# Patient Record
Sex: Male | Born: 1955 | Race: White | Hispanic: No | Marital: Married
Health system: Southern US, Community
[De-identification: ages and names within clinical notes are randomized; demographics above are authoritative.]

## PROBLEM LIST (undated history)

## (undated) DIAGNOSIS — J398 Other specified diseases of upper respiratory tract: Secondary | ICD-10-CM

## (undated) DIAGNOSIS — I6329 Cerebral infarction due to unspecified occlusion or stenosis of other precerebral arteries: Secondary | ICD-10-CM

## (undated) DIAGNOSIS — Z930 Tracheostomy status: Secondary | ICD-10-CM

## (undated) DIAGNOSIS — J9621 Acute and chronic respiratory failure with hypoxia: Secondary | ICD-10-CM

---

## 2018-09-16 ENCOUNTER — Inpatient Hospital Stay
Admission: RE | Admit: 2018-09-16 | Discharge: 2018-10-15 | Disposition: A | Discharge status: Skilled Nursing Facility | Payer: Medicare Other | Source: Other Acute Inpatient Hospital | Attending: Internal Medicine | Admitting: Internal Medicine

## 2018-09-16 ENCOUNTER — Other Ambulatory Visit (HOSPITAL_COMMUNITY): Payer: Self-pay

## 2018-09-16 DIAGNOSIS — I6329 Cerebral infarction due to unspecified occlusion or stenosis of other precerebral arteries: Secondary | ICD-10-CM | POA: Diagnosis present

## 2018-09-16 DIAGNOSIS — J398 Other specified diseases of upper respiratory tract: Secondary | ICD-10-CM | POA: Diagnosis present

## 2018-09-16 DIAGNOSIS — Z931 Gastrostomy status: Secondary | ICD-10-CM

## 2018-09-16 DIAGNOSIS — Z930 Tracheostomy status: Secondary | ICD-10-CM

## 2018-09-16 DIAGNOSIS — J9621 Acute and chronic respiratory failure with hypoxia: Secondary | ICD-10-CM | POA: Diagnosis present

## 2018-09-16 DIAGNOSIS — Z431 Encounter for attention to gastrostomy: Secondary | ICD-10-CM

## 2018-09-16 HISTORY — DX: Tracheostomy status: Z93.0

## 2018-09-16 HISTORY — DX: Acute and chronic respiratory failure with hypoxia: J96.21

## 2018-09-16 HISTORY — DX: Other specified diseases of upper respiratory tract: J39.8

## 2018-09-16 HISTORY — DX: Cerebral infarction due to unspecified occlusion or stenosis of other precerebral arteries: I63.29

## 2018-09-16 MED ORDER — IOHEXOL 300 MG/ML  SOLN
50.0000 mL | Freq: Once | INTRAMUSCULAR | Status: AC | PRN
  Administered 2018-09-16: 50 mL via ORAL

## 2018-09-17 ENCOUNTER — Other Ambulatory Visit (HOSPITAL_COMMUNITY): Payer: Self-pay

## 2018-09-17 ENCOUNTER — Encounter: Payer: Self-pay | Admitting: Internal Medicine

## 2018-09-17 DIAGNOSIS — J9621 Acute and chronic respiratory failure with hypoxia: Secondary | ICD-10-CM

## 2018-09-17 DIAGNOSIS — J398 Other specified diseases of upper respiratory tract: Secondary | ICD-10-CM

## 2018-09-17 DIAGNOSIS — I6329 Cerebral infarction due to unspecified occlusion or stenosis of other precerebral arteries: Secondary | ICD-10-CM

## 2018-09-17 DIAGNOSIS — Z930 Tracheostomy status: Secondary | ICD-10-CM | POA: Diagnosis not present

## 2018-09-17 LAB — COMPREHENSIVE METABOLIC PANEL
ALT: 92 U/L — ABNORMAL HIGH (ref 0–44)
AST: 39 U/L (ref 15–41)
Albumin: 3.4 g/dL — ABNORMAL LOW (ref 3.5–5.0)
Alkaline Phosphatase: 93 U/L (ref 38–126)
Anion gap: 11 (ref 5–15)
BUN: 40 mg/dL — ABNORMAL HIGH (ref 8–23)
CO2: 23 mmol/L (ref 22–32)
Calcium: 9.7 mg/dL (ref 8.9–10.3)
Chloride: 112 mmol/L — ABNORMAL HIGH (ref 98–111)
Creatinine, Ser: 1.2 mg/dL (ref 0.61–1.24)
GFR calc Af Amer: >60 mL/min (ref >60)
GFR calc non Af Amer: >60 mL/min (ref >60)
Glucose, Bld: 155 mg/dL — ABNORMAL HIGH (ref 70–99)
Potassium: 3.9 mmol/L (ref 3.5–5.1)
Sodium: 146 mmol/L — ABNORMAL HIGH (ref 135–145)
Total Bilirubin: 1.5 mg/dL — ABNORMAL HIGH (ref 0.3–1.2)
Total Protein: 7.9 g/dL (ref 6.5–8.1)

## 2018-09-17 LAB — CBC WITH DIFFERENTIAL/PLATELET
Abs Immature Granulocytes: 0.22 10*3/uL — ABNORMAL HIGH (ref 0.00–0.07)
Basophils Absolute: 0.1 10*3/uL (ref 0.0–0.1)
Basophils Relative: 0 %
Eosinophils Absolute: 0 10*3/uL (ref 0.0–0.5)
Eosinophils Relative: 0 %
HCT: 45.6 % (ref 39.0–52.0)
Hemoglobin: 14.8 g/dL (ref 13.0–17.0)
Immature Granulocytes: 1 %
Lymphocytes Relative: 3 %
Lymphs Abs: 0.7 10*3/uL (ref 0.7–4.0)
MCH: 28.7 pg (ref 26.0–34.0)
MCHC: 32.5 g/dL (ref 30.0–36.0)
MCV: 88.5 fL (ref 80.0–100.0)
Monocytes Absolute: 1.7 10*3/uL — ABNORMAL HIGH (ref 0.1–1.0)
Monocytes Relative: 7 %
Neutro Abs: 22.1 10*3/uL — ABNORMAL HIGH (ref 1.7–7.7)
Neutrophils Relative %: 89 %
Platelets: 669 10*3/uL — ABNORMAL HIGH (ref 150–400)
RBC: 5.15 MIL/uL (ref 4.22–5.81)
RDW: 12.3 % (ref 11.5–15.5)
WBC: 24.8 10*3/uL — ABNORMAL HIGH (ref 4.0–10.5)
nRBC: 0 % (ref 0.0–0.2)

## 2018-09-17 LAB — C DIFFICILE QUICK SCREEN W PCR REFLEX
C Diff antigen: NEGATIVE
C Diff interpretation: NOT DETECTED
C Diff toxin: NEGATIVE

## 2018-09-17 LAB — PROTIME-INR
INR: 1.2 (ref 0.8–1.2)
Prothrombin Time: 15.2 seconds (ref 11.4–15.2)

## 2018-09-17 LAB — PHOSPHORUS: Phosphorus: 4 mg/dL (ref 2.5–4.6)

## 2018-09-17 LAB — MAGNESIUM: Magnesium: 2.4 mg/dL (ref 1.7–2.4)

## 2018-09-17 MED ORDER — NEXTERONE IV
81.00 | INTRAVENOUS | Status: DC
Start: 2018-09-17 — End: 2018-09-17

## 2018-09-17 MED ORDER — SENNOSIDES-DOCUSATE SODIUM 8.6-50 MG PO TABS
1.00 | ORAL_TABLET | ORAL | Status: DC
Start: ? — End: 2018-09-17

## 2018-09-17 MED ORDER — ERYTHROMYCIN 5 MG/GM OP OINT
TOPICAL_OINTMENT | OPHTHALMIC | Status: DC
Start: 2018-09-16 — End: 2018-09-17

## 2018-09-17 MED ORDER — VICON FORTE PO CAPS
17.00 | ORAL_CAPSULE | ORAL | Status: DC
Start: 2018-09-16 — End: 2018-09-17

## 2018-09-17 MED ORDER — GLUCAGON HCL RDNA (DIAGNOSTIC) 1 MG IJ SOLR
1.00 | INTRAMUSCULAR | Status: DC
Start: ? — End: 2018-09-17

## 2018-09-17 MED ORDER — RA COLD/COUGH DM 15-1-5 MG/5ML PO ELIX
80.00 | ORAL_SOLUTION | ORAL | Status: DC
Start: 2018-09-17 — End: 2018-09-17

## 2018-09-17 MED ORDER — Medication
5.00 | Status: DC
Start: ? — End: 2018-09-17

## 2018-09-17 MED ORDER — BENICAR 20 MG PO TABS
12.50 | ORAL_TABLET | ORAL | Status: DC
Start: ? — End: 2018-09-17

## 2018-09-17 MED ORDER — DURASORB UNDERPAD MISC
2.00 | Status: DC
Start: ? — End: 2018-09-17

## 2018-09-17 MED ORDER — PREDNISOLONE ACETATE 1 % OP SUSP
1.00 | OPHTHALMIC | Status: DC
Start: 2018-09-16 — End: 2018-09-17

## 2018-09-17 MED ORDER — Medication
Status: DC
Start: ? — End: 2018-09-17

## 2018-09-17 MED ORDER — ENOXAPARIN SODIUM 40 MG/0.4ML ~~LOC~~ SOLN
40.00 | SUBCUTANEOUS | Status: DC
Start: 2018-09-17 — End: 2018-09-17

## 2018-09-17 MED ORDER — GENERIC EXTERNAL MEDICATION
10.00 | Status: DC
Start: 2018-09-16 — End: 2018-09-17

## 2018-09-17 MED ORDER — VICON FORTE PO CAPS
17.00 | ORAL_CAPSULE | ORAL | Status: DC
Start: ? — End: 2018-09-17

## 2018-09-17 MED ORDER — GENERIC EXTERNAL MEDICATION
16.00 | Status: DC
Start: ? — End: 2018-09-17

## 2018-09-17 MED ORDER — ASPIRIN 300 MG RE SUPP
300.00 | RECTAL | Status: DC
Start: ? — End: 2018-09-17

## 2018-09-17 MED ORDER — DICLOXACILLIN SODIUM 62.5 MG/5ML PO SUSR
10.00 | ORAL | Status: DC
Start: ? — End: 2018-09-17

## 2018-09-17 MED ORDER — MULTIVITAMIN WOMEN PO
1.00 | ORAL | Status: DC
Start: ? — End: 2018-09-17

## 2018-09-17 MED ORDER — NITROGLYCERIN 0.4 MG SL SUBL
0.40 | SUBLINGUAL_TABLET | SUBLINGUAL | Status: DC
Start: ? — End: 2018-09-17

## 2018-09-17 MED ORDER — LACTULOSE 10 GM/15ML PO SOLN
20.00 | ORAL | Status: DC
Start: ? — End: 2018-09-17

## 2018-09-17 MED ORDER — CLOPIDOGREL BISULFATE 75 MG PO TABS
75.00 | ORAL_TABLET | ORAL | Status: DC
Start: 2018-09-17 — End: 2018-09-17

## 2018-09-17 MED ORDER — PETROLATUM WHITE EX
10.00 | CUTANEOUS | Status: DC
Start: ? — End: 2018-09-17

## 2018-09-17 NOTE — Consult Note (Signed)
[facility] [place]  Date of Service: 09/17/2018  PULMONARY CRITICAL CARE CONSULT   Jaicob Jimenez  SWF:[id number]  DOB: Jun 11, 1955   DOA: 09/16/2018  Referring Physician: Merton Border, MD  HPI: Adam Jimenez is a 63 y.o. male seen for follow up of Acute on Chronic Respiratory Failure.  Patient has multiple medical problems including hypertension hyperlipidemia transient ischemic attacks presented to the hospital with facial droop.  Patient apparently was unable to get up out of bed because of dizziness.  Patient ended up in the emergency department CT at that time was not done because the scanner was down.  It was also felt that the patient was outside thrombolytics window.  Eventually patient ended up with an MRI of the brain showed an acute infarction of the left cerebellum right pons and middle cerebellar peduncle along with left thalamus.  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  Hospital course was complicated by not being able to come off the ventilator and ended up with prolonged mechanical ventilation.  Patient eventually ended up having to have a tracheostomy.  Now patient is off the ventilator on T collar.  Is been somewhat confused.  Review of Systems:  ROS performed and is unremarkable other than noted above.  Past Medical History: Recent TIA, HTN just started on norvasc, dyslipidemia  Past Surgical History: Fracture repair LLE  Allergies: Allergies not on file  Medications: Medications Prior to Admission  Medication Sig Dispense Refill Last Dose  . amLODIPine (NORVASC) 5 MG tablet Take 5 mg by mouth daily.  Marland Kitchen aspirin 325 MG tablet Take 325 mg by mouth daily.  Marland Kitchen atorvastatin (LIPITOR) 80 MG tablet Take 80 mg by mouth daily.    Family History: Arthritis   Social History: Never smoked, no daily etoh, denies drug use    Medications: Reviewed on Rounds  Physical Exam:  Vitals: Temperature 98.3 pulse 79 respiratory rate 20 blood pressure 147/84 saturations 97%  Ventilator Settings off the ventilator on T collar 28% FiO2  . General: Comfortable at this time . Eyes: Grossly normal lids, irises & conjunctiva . ENT: grossly tongue is normal . Neck: no obvious mass . Cardiovascular: S1-S2 normal no gallop or rub . Respiratory: No rhonchi no rales are noted at this time . Abdomen: Soft and nontender . Skin: no rash seen on limited exam . Musculoskeletal: not rigid . Psychiatric:unable to assess . Neurologic: no seizure no involuntary movements         Labs on Admission:  Basic Metabolic Panel: Recent Labs  Lab 09/17/18 0857  NA 146*  K 3.9  CL 112*  CO2 23  GLUCOSE 155*  BUN 40*  CREATININE 1.20  CALCIUM 9.7  MG 2.4  PHOS 4.0    No results for input(s): PHART, PCO2ART, PO2ART, HCO3, O2SAT in the last 168 hours.  Liver Function Tests: Recent Labs  Lab 09/17/18 0857  AST 39  ALT 92*  ALKPHOS 93  BILITOT 1.5*  PROT 7.9  ALBUMIN 3.4*   No results for input(s): LIPASE, AMYLASE in the last 168 hours. No results for input(s): AMMONIA in the last 168 hours.  CBC: Recent Labs  Lab 09/17/18 0857  WBC 24.8*  NEUTROABS 22.1*  HGB 14.8  HCT 45.6  MCV 88.5  PLT 669*    Cardiac Enzymes: No results for input(s): CKTOTAL, CKMB, CKMBINDEX, TROPONINI in the last 168 hours.  BNP (last 3  results) No results for input(s): BNP in the last 8760 hours.  ProBNP (last 3 results) No results for input(s): PROBNP in the last 8760 hours.   Radiological Exams on Admission: Dg Abdomen Peg Tube Location  Result Date: 09/16/2018 CLINICAL DATA:  Peg tube placement EXAM: ABDOMEN - 1 VIEW COMPARISON:  None. FINDINGS: 50 mL Omnipaque 300 administered through patient's PEG tube. Single overhead radiograph obtained. Gastrostomy balloon visible within the body of the stomach. Contrast opacifies the  stomach and proximal duodenum. No obvious extravasation. IMPRESSION: Peg tube appears within the proximal to mid gastric body. No extraluminal extravasation Electronically Signed   By: Adam Jimenez  Adam JimenezD.   On: 09/16/2018 21:52   Dg Chest Port 1 View  Result Date: 09/17/2018 CLINICAL DATA:  Tracheostomy. EXAM: PORTABLE CHEST 1 VIEW COMPARISON:  None. FINDINGS: Tracheostomy tube is in place. The heart is enlarged. Lung volumes are low. Mild pulmonary vascular congestion is present without frank edema. There is no significant consolidation. Visualized soft tissues and bony thorax are unremarkable. IMPRESSION: 1. Cardiomegaly and mild pulmonary vascular congestion without frank edema. 2. Low lung volumes. 3. Tracheostomy tube in situ. Electronically Signed   By: Adam Jimenez  Adam Jimenez JimenezD.   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 Acute on chronic respiratory failure with hypoxia at this time patient is on T collar weaning.  Secretions are still significant and copious.  Will continue with aggressive pulmonary toilet supportive care hopefully will be able to begin capping if secretions do improve 2. Cerebellar stroke patient had basilar artery occlusion multiple involvement.  Continue with supportive care patient is status post thrombectomy. 3. Tracheostomy we are going to work towards weaning and eventual decannulation. 4. Increased tracheal secretions.  The patient has been having issues with the secretions even at the other facility and pulmonary recommendations over they are aware not to downsize the trach because of the excessive secretions.  We will continue with aggressive pulmonary toilet consider anticholinergics if not improved we will continue to assess daily  I have personally seen and  evaluated the patient, evaluated laboratory and imaging results, formulated the assessment and plan and placed orders. The Patient requires high complexity decision making for assessment and support.  Case was discussed on Rounds with the Respiratory Therapy Staff Time Spent 70minutes  Yevonne PaxSaadat A , MD [facility] Pulmonary Critical Care Medicine Sleep Medicine

## 2018-09-18 DIAGNOSIS — J9621 Acute and chronic respiratory failure with hypoxia: Secondary | ICD-10-CM | POA: Diagnosis not present

## 2018-09-18 DIAGNOSIS — I6329 Cerebral infarction due to unspecified occlusion or stenosis of other precerebral arteries: Secondary | ICD-10-CM | POA: Diagnosis not present

## 2018-09-18 DIAGNOSIS — Z930 Tracheostomy status: Secondary | ICD-10-CM | POA: Diagnosis not present

## 2018-09-18 DIAGNOSIS — J398 Other specified diseases of upper respiratory tract: Secondary | ICD-10-CM | POA: Diagnosis not present

## 2018-09-18 LAB — URINALYSIS, ROUTINE W REFLEX MICROSCOPIC
Bilirubin Urine: NEGATIVE
Glucose, UA: NEGATIVE mg/dL
Ketones, ur: NEGATIVE mg/dL
Nitrite: NEGATIVE
Protein, ur: 100 mg/dL — AB
RBC / HPF: >50 RBC/hpf — ABNORMAL HIGH (ref 0–5)
Specific Gravity, Urine: 1.024 (ref 1.005–1.030)
pH: 5 (ref 5.0–8.0)

## 2018-09-18 NOTE — Progress Notes (Addendum)
P[facility] [place]   PULMONARY CRITICAL CARE SERVICE  PROGRESS NOTE  Date of Service: 09/18/2018  Adam Jimenez  ZOX:[id number]  DOB: 1955-12-24   DOA: 09/16/2018  Referring Physician: Merton Border, MD  HPI: Adam Jimenez is a 63 y.o. male seen for follow up of Acute on Chronic Respiratory Failure.  Patient continues on aerosol trach collar 28 FiO2 satting well currently with no distress noted.  Medications: Reviewed on Rounds  Physical Exam:  Vitals: Pulse 94 respirations 20 BP 122/75 O2 sat 97% temp 97.1  Ventilator Settings ATC 28%  . General: Comfortable at this time . Eyes: Grossly normal lids, irises & conjunctiva . ENT: grossly tongue is normal . Neck: no obvious mass . Cardiovascular: S1 S2 normal no gallop . Respiratory: No rales or rhonchi noted . Abdomen: soft . Skin: no rash seen on limited exam . Musculoskeletal: not rigid . Psychiatric:unable to assess . Neurologic: no seizure no involuntary movements         Lab Data:   Basic Metabolic Panel: Recent Labs  Lab 09/17/18 0857  NA 146*  K 3.9  CL 112*  CO2 23  GLUCOSE 155*  BUN 40*  CREATININE 1.20  CALCIUM 9.7  MG 2.4  PHOS 4.0    ABG: No results for input(s): PHART, PCO2ART, PO2ART, HCO3, O2SAT in the last 168 hours.  Liver Function Tests: Recent Labs  Lab 09/17/18 0857  AST 39  ALT 92*  ALKPHOS 93  BILITOT 1.5*  PROT 7.9  ALBUMIN 3.4*   No results for input(s): LIPASE, AMYLASE in the last 168 hours. No results for input(s): AMMONIA in the last 168 hours.  CBC: Recent Labs  Lab 09/17/18 0857  WBC 24.8*  NEUTROABS 22.1*  HGB 14.8  HCT 45.6  MCV 88.5  PLT 669*    Cardiac Enzymes: No results for input(s): CKTOTAL, CKMB, CKMBINDEX, TROPONINI in the last 168 hours.  BNP (last 3 results) No results for input(s): BNP in the last 8760 hours.  ProBNP (last 3 results) No results for input(s): PROBNP in the last 8760  hours.  Radiological Exams: Dg Abdomen Peg Tube Location  Result Date: 09/16/2018 CLINICAL DATA:  Peg tube placement EXAM: ABDOMEN - 1 VIEW COMPARISON:  None. FINDINGS: 50 mL Omnipaque 300 administered through patient's PEG tube. Single overhead radiograph obtained. Gastrostomy balloon visible within the body of the stomach. Contrast opacifies the stomach and proximal duodenum. No obvious extravasation. IMPRESSION: Peg tube appears within the proximal to mid gastric body. No extraluminal extravasation Electronically Signed   By: Donavan Foil M.D.   On: 09/16/2018 21:52   Dg Chest Port 1 View  Result Date: 09/17/2018 CLINICAL DATA:  Tracheostomy. EXAM: PORTABLE CHEST 1 VIEW COMPARISON:  None. FINDINGS: Tracheostomy tube is in place. The heart is enlarged. Lung volumes are low. Mild pulmonary vascular congestion is present without frank edema. There is no significant consolidation. Visualized soft tissues and bony thorax are unremarkable. IMPRESSION: 1. Cardiomegaly and mild pulmonary vascular congestion without frank edema. 2. Low lung volumes. 3. Tracheostomy tube in situ. Electronically Signed   By: San Morelle M.D.   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 Acute on chronic respiratory failure hypoxia patient will continue T collar weaning as he is today on 20% FiO2.  Patient continues to have a moderate amount of secretions continue present pulmonary toilet and supportive measures. 2. Cerebellar stroke with basilar artery occlusion.  Continue supportive care as patient is status post thrombectomy 3. Tracheostomy working towards weaning and eventual decannulation 4. Increased tracheal secretions continue pulmonary toileting secretion management at this time.   I have personally  seen and evaluated the patient, evaluated laboratory and imaging results, formulated the assessment and plan and placed orders. The Patient requires high complexity decision making for assessment and support.  Case was discussed on Rounds with the Respiratory Therapy Staff  Yevonne PaxSaadat A Khan, MD [facility] Pulmonary Critical Care Medicine Sleep Medicine

## 2018-09-19 ENCOUNTER — Other Ambulatory Visit (HOSPITAL_COMMUNITY): Payer: Self-pay

## 2018-09-19 DIAGNOSIS — J9621 Acute and chronic respiratory failure with hypoxia: Secondary | ICD-10-CM | POA: Diagnosis not present

## 2018-09-19 DIAGNOSIS — I6329 Cerebral infarction due to unspecified occlusion or stenosis of other precerebral arteries: Secondary | ICD-10-CM | POA: Diagnosis not present

## 2018-09-19 DIAGNOSIS — Z930 Tracheostomy status: Secondary | ICD-10-CM | POA: Diagnosis not present

## 2018-09-19 DIAGNOSIS — J398 Other specified diseases of upper respiratory tract: Secondary | ICD-10-CM | POA: Diagnosis not present

## 2018-09-19 LAB — CBC
HCT: 45.8 % (ref 39.0–52.0)
Hemoglobin: 14.7 g/dL (ref 13.0–17.0)
MCH: 29.6 pg (ref 26.0–34.0)
MCHC: 32.1 g/dL (ref 30.0–36.0)
MCV: 92.2 fL (ref 80.0–100.0)
Platelets: 425 10*3/uL — ABNORMAL HIGH (ref 150–400)
RBC: 4.97 MIL/uL (ref 4.22–5.81)
RDW: 12.8 % (ref 11.5–15.5)
WBC: 16 10*3/uL — ABNORMAL HIGH (ref 4.0–10.5)
nRBC: 0 % (ref 0.0–0.2)

## 2018-09-19 LAB — COMPREHENSIVE METABOLIC PANEL
ALT: 77 U/L — ABNORMAL HIGH (ref 0–44)
AST: 40 U/L (ref 15–41)
Albumin: 2.9 g/dL — ABNORMAL LOW (ref 3.5–5.0)
Alkaline Phosphatase: 84 U/L (ref 38–126)
Anion gap: 10 (ref 5–15)
BUN: 41 mg/dL — ABNORMAL HIGH (ref 8–23)
CO2: 24 mmol/L (ref 22–32)
Calcium: 9.9 mg/dL (ref 8.9–10.3)
Chloride: 116 mmol/L — ABNORMAL HIGH (ref 98–111)
Creatinine, Ser: 1.05 mg/dL (ref 0.61–1.24)
GFR calc Af Amer: >60 mL/min (ref >60)
GFR calc non Af Amer: >60 mL/min (ref >60)
Glucose, Bld: 99 mg/dL (ref 70–99)
Potassium: 3.9 mmol/L (ref 3.5–5.1)
Sodium: 150 mmol/L — ABNORMAL HIGH (ref 135–145)
Total Bilirubin: 1.8 mg/dL — ABNORMAL HIGH (ref 0.3–1.2)
Total Protein: 7.4 g/dL (ref 6.5–8.1)

## 2018-09-19 LAB — URINE CULTURE: Culture: NO GROWTH

## 2018-09-19 LAB — MAGNESIUM: Magnesium: 2.6 mg/dL — ABNORMAL HIGH (ref 1.7–2.4)

## 2018-09-19 MED ORDER — GENERIC EXTERNAL MEDICATION
Status: DC
Start: ? — End: 2018-09-19

## 2018-09-19 NOTE — Progress Notes (Signed)
IR requested by Dr. Nicoletta Dress for image-guided gastrostomy tube replacement/exchange.  RN states that patient pulled out his gtube (of note, not placed in IR). Per Dr. Anselm Pancoast, plan for exchange/replacement Monday 6/22 in IR (schedule permitting). No sedation- ok to continue diet and thinners. Consent obtained from wife via telephone, signed and in IR.  Please call IR with questions/concerns.   Bea Graff Louk, PA-C 09/19/2018, 3:01 PM

## 2018-09-19 NOTE — Progress Notes (Addendum)
Pulmonary [place]   PULMONARY CRITICAL CARE SERVICE  PROGRESS NOTE  Date of Service: 09/19/2018  [name]  UUV:[id number]  DOB: 08/23/1955   DOA: 09/16/2018  Referring Physician: Merton Border, MD  HPI: Adam Jimenez is a 63 y.o. male seen for follow up of Acute on Chronic Respiratory Failure.  Continues on 28% aerosol trach collar satting well no fever or distress.  Medications: Reviewed on Rounds  Physical Exam:  Vitals: Pulse 40 respirations 20 BP 114/69 O2 sat 96% to 96.0  Ventilator Settings 28% ATC  . General: Comfortable at this time . Eyes: Grossly normal lids, irises & conjunctiva . ENT: grossly tongue is normal . Neck: no obvious mass . Cardiovascular: S1 S2 normal no gallop . Respiratory: No rales or rhonchi noted . Abdomen: soft . Skin: no rash seen on limited exam . Musculoskeletal: not rigid . Psychiatric:unable to assess . Neurologic: no seizure no involuntary movements         Lab Data:   Basic Metabolic Panel: Recent Labs  Lab 09/17/18 0857 09/19/18 0614  NA 146* 150*  K 3.9 3.9  CL 112* 116*  CO2 23 24  GLUCOSE 155* 99  BUN 40* 41*  CREATININE 1.20 1.05  CALCIUM 9.7 9.9  MG 2.4 2.6*  PHOS 4.0  --     ABG: No results for input(s): PHART, PCO2ART, PO2ART, HCO3, O2SAT in the last 168 hours.  Liver Function Tests: Recent Labs  Lab 09/17/18 0857 09/19/18 0614  AST 39 40  ALT 92* 77*  ALKPHOS 93 84  BILITOT 1.5* 1.8*  PROT 7.9 7.4  ALBUMIN 3.4* 2.9*   No results for input(s): LIPASE, AMYLASE in the last 168 hours. No results for input(s): AMMONIA in the last 168 hours.  CBC: Recent Labs  Lab 09/17/18 0857 09/19/18 0614  WBC 24.8* 16.0*  NEUTROABS 22.1*  --   HGB 14.8 14.7  HCT 45.6 45.8  MCV 88.5 92.2  PLT 669* 425*    Cardiac Enzymes: No results for input(s): CKTOTAL, CKMB, CKMBINDEX, TROPONINI in the last 168 hours.  BNP (last 3 results) No results for input(s): BNP in  the last 8760 hours.  ProBNP (last 3 results) No results for input(s): PROBNP in the last 8760 hours.  Radiological Exams: Dg Abdomen Peg Tube Location  Result Date: 09/19/2018 CLINICAL DATA:  Peg tube removed by patient. Foley catheter introduced into PEG tract with contrast injected. EXAM: ABDOMEN - 1 VIEW COMPARISON:  None. FINDINGS: Catheter is noted overlying the stomach filled with contrast. The stomach contains oral contrast. No extraluminal contrast is otherwise noted. IMPRESSION: Catheter placed through the PEG tract noted with tip in the stomach. Electronically Signed   By: Margarette Canada M.D.   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 Acute on chronic respiratory failure hypoxia patient will continue T collar weaning as he is today on 28% FiO2.  Patient continues to have a moderate amount of secretions continue present pulmonary toilet and supportive measures. 2. Cerebellar stroke with basilar artery occlusion.  Continue supportive care as patient is status post thrombectomy 3. Tracheostomy working towards weaning and eventual decannulation 4. Increased tracheal secretions continue pulmonary toileting secretion management at this time.   I have personally seen and evaluated the patient, evaluated laboratory and  imaging results, formulated the assessment and plan and placed orders. The Patient requires high complexity decision making for assessment and support.  Case was discussed on Rounds with the Respiratory Therapy Staff  Allyne Gee, MD [facility] Pulmonary Critical Care Medicine Sleep Medicine

## 2018-09-20 DIAGNOSIS — Z930 Tracheostomy status: Secondary | ICD-10-CM | POA: Diagnosis not present

## 2018-09-20 DIAGNOSIS — J9621 Acute and chronic respiratory failure with hypoxia: Secondary | ICD-10-CM | POA: Diagnosis not present

## 2018-09-20 DIAGNOSIS — I6329 Cerebral infarction due to unspecified occlusion or stenosis of other precerebral arteries: Secondary | ICD-10-CM | POA: Diagnosis not present

## 2018-09-20 DIAGNOSIS — J398 Other specified diseases of upper respiratory tract: Secondary | ICD-10-CM | POA: Diagnosis not present

## 2018-09-20 LAB — BASIC METABOLIC PANEL
Anion gap: 12 (ref 5–15)
BUN: 34 mg/dL — ABNORMAL HIGH (ref 8–23)
CO2: 24 mmol/L (ref 22–32)
Calcium: 9.5 mg/dL (ref 8.9–10.3)
Chloride: 112 mmol/L — ABNORMAL HIGH (ref 98–111)
Creatinine, Ser: 0.89 mg/dL (ref 0.61–1.24)
GFR calc Af Amer: >60 mL/min (ref >60)
GFR calc non Af Amer: >60 mL/min (ref >60)
Glucose, Bld: 104 mg/dL — ABNORMAL HIGH (ref 70–99)
Potassium: 3.9 mmol/L (ref 3.5–5.1)
Sodium: 148 mmol/L — ABNORMAL HIGH (ref 135–145)

## 2018-09-20 LAB — CBC
HCT: 48.8 % (ref 39.0–52.0)
Hemoglobin: 15.8 g/dL (ref 13.0–17.0)
MCH: 28.9 pg (ref 26.0–34.0)
MCHC: 32.4 g/dL (ref 30.0–36.0)
MCV: 89.4 fL (ref 80.0–100.0)
Platelets: 427 10*3/uL — ABNORMAL HIGH (ref 150–400)
RBC: 5.46 MIL/uL (ref 4.22–5.81)
RDW: 12.3 % (ref 11.5–15.5)
WBC: 11.9 10*3/uL — ABNORMAL HIGH (ref 4.0–10.5)
nRBC: 0 % (ref 0.0–0.2)

## 2018-09-20 LAB — CULTURE, RESPIRATORY W GRAM STAIN

## 2018-09-20 LAB — PHOSPHORUS: Phosphorus: 4.1 mg/dL (ref 2.5–4.6)

## 2018-09-20 LAB — MAGNESIUM: Magnesium: 2.3 mg/dL (ref 1.7–2.4)

## 2018-09-20 LAB — LACTIC ACID, PLASMA: Lactic Acid, Venous: <0.3 mmol/L — ABNORMAL LOW (ref 0.5–1.9)

## 2018-09-20 NOTE — Progress Notes (Addendum)
Pulmonary [name]   PULMONARY CRITICAL CARE SERVICE  PROGRESS NOTE  Date of Service: 09/20/2018  [name]  YTK:[id number]  DOB: 18-Dec-1955   DOA: 09/16/2018  Referring Physician: Merton Border, MD  HPI: Adam Jimenez is a 63 y.o. male seen for follow up of Acute on Chronic Respiratory Failure.  Patient remains on trach collar 28% FiO2 satting well no distress.  Medications: Reviewed on Rounds  Physical Exam:  Vitals: Pulse 57 respiration seventeen 152/86 O2 sat 97% temp 97.7  Ventilator Settings 20% TC  . General: Comfortable at this time . Eyes: Grossly normal lids, irises & conjunctiva . ENT: grossly tongue is normal . Neck: no obvious mass . Cardiovascular: S1 S2 normal no gallop . Respiratory: No rales or rhonchi noted . Abdomen: soft . Skin: no rash seen on limited exam . Musculoskeletal: not rigid . Psychiatric:unable to assess . Neurologic: no seizure no involuntary movements         Lab Data:   Basic Metabolic Panel: Recent Labs  Lab 09/17/18 0857 09/19/18 0614 09/20/18 1101  NA 146* 150* 148*  K 3.9 3.9 3.9  CL 112* 116* 112*  CO2 23 24 24   GLUCOSE 155* 99 104*  BUN 40* 41* 34*  CREATININE 1.20 1.05 0.89  CALCIUM 9.7 9.9 9.5  MG 2.4 2.6* 2.3  PHOS 4.0  --  4.1    ABG: No results for input(s): PHART, PCO2ART, PO2ART, HCO3, O2SAT in the last 168 hours.  Liver Function Tests: Recent Labs  Lab 09/17/18 0857 09/19/18 0614  AST 39 40  ALT 92* 77*  ALKPHOS 93 84  BILITOT 1.5* 1.8*  PROT 7.9 7.4  ALBUMIN 3.4* 2.9*   No results for input(s): LIPASE, AMYLASE in the last 168 hours. No results for input(s): AMMONIA in the last 168 hours.  CBC: Recent Labs  Lab 09/17/18 0857 09/19/18 0614 09/20/18 1101  WBC 24.8* 16.0* 11.9*  NEUTROABS 22.1*  --   --   HGB 14.8 14.7 15.8  HCT 45.6 45.8 48.8  MCV 88.5 92.2 89.4  PLT 669* 425* 427*    Cardiac Enzymes: No results for input(s): CKTOTAL,  CKMB, CKMBINDEX, TROPONINI in the last 168 hours.  BNP (last 3 results) No results for input(s): BNP in the last 8760 hours.  ProBNP (last 3 results) No results for input(s): PROBNP in the last 8760 hours.  Radiological Exams: Dg Abdomen Peg Tube Location  Result Date: 09/19/2018 CLINICAL DATA:  Peg tube removed by patient. Foley catheter introduced into PEG tract with contrast injected. EXAM: ABDOMEN - 1 VIEW COMPARISON:  None. FINDINGS: Catheter is noted overlying the stomach filled with contrast. The stomach contains oral contrast. No extraluminal contrast is otherwise noted. IMPRESSION: Catheter placed through the PEG tract noted with tip in the stomach. Electronically Signed   By: Margarette Canada M.D.   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 Acute on chronic respiratory failure hypoxia patient will continue T collar wean at this time of 28% FiO2 continue supportive measures and pulmonary toilet. 2. Cerebellar stroke with basilar artery occlusion. Continue supportive care as patient is status post thrombectomy 3. Tracheostomy working towards weaning and eventual decannulation 4. Increased tracheal secretions continue pulmonary toileting secretion management at this time.   I have personally seen  and evaluated the patient, evaluated laboratory and imaging results, formulated the assessment and plan and placed orders. The Patient requires high complexity decision making for assessment and support.  Case was discussed on Rounds with the Respiratory Therapy Staff  Allyne Gee, MD [facility] Pulmonary Critical Care Medicine Sleep Medicine

## 2018-09-21 ENCOUNTER — Other Ambulatory Visit (HOSPITAL_COMMUNITY): Payer: Self-pay

## 2018-09-21 ENCOUNTER — Encounter (HOSPITAL_COMMUNITY): Payer: Self-pay | Admitting: Interventional Radiology

## 2018-09-21 DIAGNOSIS — J9621 Acute and chronic respiratory failure with hypoxia: Secondary | ICD-10-CM | POA: Diagnosis not present

## 2018-09-21 DIAGNOSIS — J398 Other specified diseases of upper respiratory tract: Secondary | ICD-10-CM | POA: Diagnosis not present

## 2018-09-21 DIAGNOSIS — I6329 Cerebral infarction due to unspecified occlusion or stenosis of other precerebral arteries: Secondary | ICD-10-CM | POA: Diagnosis not present

## 2018-09-21 DIAGNOSIS — Z930 Tracheostomy status: Secondary | ICD-10-CM | POA: Diagnosis not present

## 2018-09-21 HISTORY — PX: IR REPLC GASTRO/COLONIC TUBE PERCUT W/FLUORO: IMG2333

## 2018-09-21 LAB — BASIC METABOLIC PANEL
Anion gap: 10 (ref 5–15)
BUN: 28 mg/dL — ABNORMAL HIGH (ref 8–23)
CO2: 26 mmol/L (ref 22–32)
Calcium: 9.3 mg/dL (ref 8.9–10.3)
Chloride: 112 mmol/L — ABNORMAL HIGH (ref 98–111)
Creatinine, Ser: 1.15 mg/dL (ref 0.61–1.24)
GFR calc Af Amer: >60 mL/min (ref >60)
GFR calc non Af Amer: >60 mL/min (ref >60)
Glucose, Bld: 101 mg/dL — ABNORMAL HIGH (ref 70–99)
Potassium: 3.3 mmol/L — ABNORMAL LOW (ref 3.5–5.1)
Sodium: 148 mmol/L — ABNORMAL HIGH (ref 135–145)

## 2018-09-21 LAB — VANCOMYCIN, TROUGH: Vancomycin Tr: 9 ug/mL — ABNORMAL LOW (ref 15–20)

## 2018-09-21 LAB — MAGNESIUM: Magnesium: 2.5 mg/dL — ABNORMAL HIGH (ref 1.7–2.4)

## 2018-09-21 LAB — PHOSPHORUS: Phosphorus: 4.3 mg/dL (ref 2.5–4.6)

## 2018-09-21 MED ORDER — Medication
Status: DC
Start: ? — End: 2018-09-21

## 2018-09-21 MED ORDER — IOHEXOL 300 MG/ML  SOLN: 15.0000 mL | Freq: Once | INTRAMUSCULAR | Status: DC | PRN

## 2018-09-21 MED ORDER — LIDOCAINE VISCOUS HCL 2 % MT SOLN
OROMUCOSAL | Status: AC
  Filled 2018-09-21: qty 15

## 2018-09-21 MED ORDER — GENERIC EXTERNAL MEDICATION
Status: DC
Start: ? — End: 2018-09-21

## 2018-09-21 NOTE — Progress Notes (Signed)
Pulmonary Critical Care Medicine [facility]   PULMONARY CRITICAL CARE SERVICE  PROGRESS NOTE  Date of Service: 09/21/2018  Adam HooverJerry Corlew  ZOX:[id number]  DOB: Jan 28, 1956   DOA: 09/16/2018  Referring Physician: Carron CurieAli Hijazi, MD  HPI: Adam Jimenez is a 63 y.o. male seen for follow up of Acute on Chronic Respiratory Failure.  Patient currently is on T collar has been tolerating PMV also requiring about 28% FiO2  Medications: Reviewed on Rounds  Physical Exam:  Vitals: Temperature 97.5 pulse 52 respiratory rate 15 blood pressure 136/69 saturations 98%  Ventilator Settings currently is on T collar 28% FiO2  . General: Comfortable at this time . Eyes: Grossly normal lids, irises & conjunctiva . ENT: grossly tongue is normal . Neck: no obvious mass . Cardiovascular: S1 S2 normal no gallop . Respiratory: Coarse breath sounds no rhonchi . Abdomen: soft . Skin: no rash seen on limited exam . Musculoskeletal: not rigid . Psychiatric:unable to assess . Neurologic: no seizure no involuntary movements         Lab Data:   Basic Metabolic Panel: Recent Labs  Lab 09/17/18 0857 09/19/18 0614 09/20/18 1101 09/21/18 0702  NA 146* 150* 148* 148*  K 3.9 3.9 3.9 3.3*  CL 112* 116* 112* 112*  CO2 23 24 24 26   GLUCOSE 155* 99 104* 101*  BUN 40* 41* 34* 28*  CREATININE 1.20 1.05 0.89 1.15  CALCIUM 9.7 9.9 9.5 9.3  MG 2.4 2.6* 2.3 2.5*  PHOS 4.0  --  4.1 4.3    ABG: No results for input(s): PHART, PCO2ART, PO2ART, HCO3, O2SAT in the last 168 hours.  Liver Function Tests: Recent Labs  Lab 09/17/18 0857 09/19/18 0614  AST 39 40  ALT 92* 77*  ALKPHOS 93 84  BILITOT 1.5* 1.8*  PROT 7.9 7.4  ALBUMIN 3.4* 2.9*   No results for input(s): LIPASE, AMYLASE in the last 168 hours. No results for input(s): AMMONIA in the last 168 hours.  CBC: Recent Labs  Lab 09/17/18 0857 09/19/18 0614 09/20/18 1101  WBC 24.8* 16.0* 11.9*  NEUTROABS 22.1*  --   --   HGB  14.8 14.7 15.8  HCT 45.6 45.8 48.8  MCV 88.5 92.2 89.4  PLT 669* 425* 427*    Cardiac Enzymes: No results for input(s): CKTOTAL, CKMB, CKMBINDEX, TROPONINI in the last 168 hours.  BNP (last 3 results) No results for input(s): BNP in the last 8760 hours.  ProBNP (last 3 results) No results for input(s): PROBNP in the last 8760 hours.  Radiological Exams: Ir Replc Gastro/colonic Tube Percut W/fluoro  Result Date: 09/21/2018 INDICATION: Inadvertent removal of gastrostomy catheter, with placement of temporary Foley catheter through the tract. EXAM: GASTROSTOMY CATHETER REPLACEMENT MEDICATIONS: None required ANESTHESIA/SEDATION: None required CONTRAST:  10 mL-administered into the gastric lumen. FLUOROSCOPY TIME:  6 seconds; 1 mGy COMPLICATIONS: None immediate. PROCEDURE: The Foley catheter was removed after the surrounding region had been prepped and draped in usual sterile fashion. New 24 French balloon retention gastrostomy catheter was easily advanced through the tract. The retention balloon was inflated with 7 mL sterile saline. Small contrast injection confirms intraluminal positioning. IMPRESSION: 1. Technically successful replacement of 24 French balloon retention gastrostomy catheter under fluoroscopy. Okay for routine use. Electronically Signed   By: Corlis Leak  Hassell M.D.   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 Acute on chronic respiratory failure with hypoxia we will continue with T collar trials continue secretion management patient has been tolerating PMV should be able to consider capping 2. Acute stroke grossly unchanged we will continue with present management physical therapy as tolerated 3. Tracheostomy tolerating the PMV 4. Retained tracheal secretions continue to monitor if  they improve as noted above we should be able to advance capping   I have personally seen and evaluated the patient, evaluated laboratory and imaging results, formulated the assessment and plan and placed orders. The Patient requires high complexity decision making for assessment and support.  Case was discussed on Rounds with the Respiratory Therapy Staff  Allyne Gee, MD [facility] Pulmonary Critical Care Medicine Sleep Medicine

## 2018-09-22 DIAGNOSIS — J398 Other specified diseases of upper respiratory tract: Secondary | ICD-10-CM | POA: Diagnosis not present

## 2018-09-22 DIAGNOSIS — Z930 Tracheostomy status: Secondary | ICD-10-CM | POA: Diagnosis not present

## 2018-09-22 DIAGNOSIS — I6329 Cerebral infarction due to unspecified occlusion or stenosis of other precerebral arteries: Secondary | ICD-10-CM | POA: Diagnosis not present

## 2018-09-22 DIAGNOSIS — J9621 Acute and chronic respiratory failure with hypoxia: Secondary | ICD-10-CM | POA: Diagnosis not present

## 2018-09-22 LAB — BASIC METABOLIC PANEL WITH GFR
Anion gap: 12 (ref 5–15)
BUN: 24 mg/dL — ABNORMAL HIGH (ref 8–23)
CO2: 21 mmol/L — ABNORMAL LOW (ref 22–32)
Calcium: 9.1 mg/dL (ref 8.9–10.3)
Chloride: 112 mmol/L — ABNORMAL HIGH (ref 98–111)
Creatinine, Ser: 0.94 mg/dL (ref 0.61–1.24)
GFR calc Af Amer: >60 mL/min
GFR calc non Af Amer: >60 mL/min
Glucose, Bld: 97 mg/dL (ref 70–99)
Potassium: 3.9 mmol/L (ref 3.5–5.1)
Sodium: 145 mmol/L (ref 135–145)

## 2018-09-22 LAB — CULTURE, BLOOD (ROUTINE X 2)
Culture: NO GROWTH
Culture: NO GROWTH
Special Requests: ADEQUATE
Special Requests: ADEQUATE

## 2018-09-22 LAB — CBC
HCT: 43.5 % (ref 39.0–52.0)
Hemoglobin: 14.1 g/dL (ref 13.0–17.0)
MCH: 29.7 pg (ref 26.0–34.0)
MCHC: 32.4 g/dL (ref 30.0–36.0)
MCV: 91.8 fL (ref 80.0–100.0)
Platelets: 360 10*3/uL (ref 150–400)
RBC: 4.74 MIL/uL (ref 4.22–5.81)
RDW: 12.7 % (ref 11.5–15.5)
WBC: 11.2 10*3/uL — ABNORMAL HIGH (ref 4.0–10.5)
nRBC: 0 % (ref 0.0–0.2)

## 2018-09-22 LAB — PHOSPHORUS: Phosphorus: 3.7 mg/dL (ref 2.5–4.6)

## 2018-09-22 LAB — MAGNESIUM: Magnesium: 2.4 mg/dL (ref 1.7–2.4)

## 2018-09-22 NOTE — Progress Notes (Signed)
[facility]   PULMONARY CRITICAL CARE SERVICE  PROGRESS NOTE  Date of Service: 09/22/2018  Adam HooverJerry Jimenez  WUJ:[id number]  DOB: 12/10/1955   DOA: 09/16/2018  Referring Physician: Carron CurieAli Hijazi, MD  HPI: Adam HooverJerry Jimenez is a 63 y.o. male seen for follow up of Acute on Chronic Respiratory Failure.  Patient remains on T collar has been on 28% FiO2 with good saturations  Medications: Reviewed on Rounds  Physical Exam:  Vitals: Temperature 97.8 pulse 53 respiratory 18 blood pressure 122/77 saturations 97%  Ventilator Settings off the ventilator on T collar right now  . General: Comfortable at this time . Eyes: Grossly normal lids, irises & conjunctiva . ENT: grossly tongue is normal . Neck: no obvious mass . Cardiovascular: S1 S2 normal no gallop . Respiratory: No rhonchi no rales . Abdomen: soft . Skin: no rash seen on limited exam . Musculoskeletal: not rigid . Psychiatric:unable to assess . Neurologic: no seizure no involuntary movements         Lab Data:   Basic Metabolic Panel: Recent Labs  Lab 09/17/18 0857 09/19/18 0614 09/20/18 1101 09/21/18 0702 09/22/18 0718  NA 146* 150* 148* 148* 145  K 3.9 3.9 3.9 3.3* 3.9  CL 112* 116* 112* 112* 112*  CO2 23 24 24 26  21*  GLUCOSE 155* 99 104* 101* 97  BUN 40* 41* 34* 28* 24*  CREATININE 1.20 1.05 0.89 1.15 0.94  CALCIUM 9.7 9.9 9.5 9.3 9.1  MG 2.4 2.6* 2.3 2.5* 2.4  PHOS 4.0  --  4.1 4.3 3.7    ABG: No results for input(s): PHART, PCO2ART, PO2ART, HCO3, O2SAT in the last 168 hours.  Liver Function Tests: Recent Labs  Lab 09/17/18 0857 09/19/18 0614  AST 39 40  ALT 92* 77*  ALKPHOS 93 84  BILITOT 1.5* 1.8*  PROT 7.9 7.4  ALBUMIN 3.4* 2.9*   No results for input(s): LIPASE, AMYLASE in the last 168 hours. No results for input(s): AMMONIA in the last 168 hours.  CBC: Recent Labs  Lab 09/17/18 0857 09/19/18 0614 09/20/18 1101 09/22/18 0718  WBC 24.8*  16.0* 11.9* 11.2*  NEUTROABS 22.1*  --   --   --   HGB 14.8 14.7 15.8 14.1  HCT 45.6 45.8 48.8 43.5  MCV 88.5 92.2 89.4 91.8  PLT 669* 425* 427* 360    Cardiac Enzymes: No results for input(s): CKTOTAL, CKMB, CKMBINDEX, TROPONINI in the last 168 hours.  BNP (last 3 results) No results for input(s): BNP in the last 8760 hours.  ProBNP (last 3 results) No results for input(s): PROBNP in the last 8760 hours.  Radiological Exams: Ir Replc Gastro/colonic Tube Percut W/fluoro  Result Date: 09/21/2018 INDICATION: Inadvertent removal of gastrostomy catheter, with placement of temporary Foley catheter through the tract. EXAM: GASTROSTOMY CATHETER REPLACEMENT MEDICATIONS: None required ANESTHESIA/SEDATION: None required CONTRAST:  10 mL-administered into the gastric lumen. FLUOROSCOPY TIME:  6 seconds; 1 mGy COMPLICATIONS: None immediate. PROCEDURE: The Foley catheter was removed after the surrounding region had been prepped and draped in usual sterile fashion. New 24 French balloon retention gastrostomy catheter was easily advanced through the tract. The retention balloon was inflated with 7 mL sterile saline. Small contrast injection confirms intraluminal positioning. IMPRESSION: 1. Technically successful replacement of 24 French balloon retention gastrostomy catheter under fluoroscopy. Okay for routine use. Electronically Signed   By: Corlis Leak  Hassell M.D.   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 Acute on chronic respiratory failure with hypoxia we will continue with T collar trials titrate oxygen continue pulmonary toilet. 2. Acute vertebrobasilar stroke continue with present management remains unchanged 3. Tracheostomy weaning on T collar secretions are still significant 4. Retained tracheal  secretions as above continue aggressive pulmonary toilet   I have personally seen and evaluated the patient, evaluated laboratory and imaging results, formulated the assessment and plan and placed orders. The Patient requires high complexity decision making for assessment and support.  Case was discussed on Rounds with the Respiratory Therapy Staff  Allyne Gee, MD [facility] Pulmonary Critical Care Medicine Sleep Medicine

## 2018-09-23 DIAGNOSIS — J398 Other specified diseases of upper respiratory tract: Secondary | ICD-10-CM | POA: Diagnosis not present

## 2018-09-23 DIAGNOSIS — Z930 Tracheostomy status: Secondary | ICD-10-CM | POA: Diagnosis not present

## 2018-09-23 DIAGNOSIS — J9621 Acute and chronic respiratory failure with hypoxia: Secondary | ICD-10-CM | POA: Diagnosis not present

## 2018-09-23 DIAGNOSIS — I6329 Cerebral infarction due to unspecified occlusion or stenosis of other precerebral arteries: Secondary | ICD-10-CM | POA: Diagnosis not present

## 2018-09-23 LAB — BASIC METABOLIC PANEL WITH GFR
Anion gap: 8 (ref 5–15)
BUN: 22 mg/dL (ref 8–23)
CO2: 27 mmol/L (ref 22–32)
Calcium: 9.2 mg/dL (ref 8.9–10.3)
Chloride: 110 mmol/L (ref 98–111)
Creatinine, Ser: 0.94 mg/dL (ref 0.61–1.24)
GFR calc Af Amer: >60 mL/min
GFR calc non Af Amer: >60 mL/min
Glucose, Bld: 93 mg/dL (ref 70–99)
Potassium: 3.5 mmol/L (ref 3.5–5.1)
Sodium: 145 mmol/L (ref 135–145)

## 2018-09-23 LAB — CBC
HCT: 40.9 % (ref 39.0–52.0)
Hemoglobin: 13.3 g/dL (ref 13.0–17.0)
MCH: 29.4 pg (ref 26.0–34.0)
MCHC: 32.5 g/dL (ref 30.0–36.0)
MCV: 90.3 fL (ref 80.0–100.0)
Platelets: 394 10*3/uL (ref 150–400)
RBC: 4.53 MIL/uL (ref 4.22–5.81)
RDW: 12.4 % (ref 11.5–15.5)
WBC: 12.3 10*3/uL — ABNORMAL HIGH (ref 4.0–10.5)
nRBC: 0 % (ref 0.0–0.2)

## 2018-09-23 LAB — VANCOMYCIN, TROUGH: Vancomycin Tr: 19 ug/mL (ref 15–20)

## 2018-09-23 LAB — PHOSPHORUS: Phosphorus: 3.2 mg/dL (ref 2.5–4.6)

## 2018-09-23 LAB — MAGNESIUM: Magnesium: 2.2 mg/dL (ref 1.7–2.4)

## 2018-09-23 NOTE — Progress Notes (Addendum)
Pulmonary [place]   PULMONARY CRITICAL CARE SERVICE  PROGRESS NOTE  Date of Service: 09/23/2018  Adam Jimenez  [id number]:[id number]  DOB: [birth year]   DOA: 09/16/2018  Referring Physician: Merton Border, MD  HPI: Adam Jimenez is a 63 y.o. male seen for follow up of Acute on Chronic Respiratory Failure.  Patient is on trach collar 28% FiO2 using PMV with no difficulty at this time.  Medications: Reviewed on Rounds  Physical Exam:  Vitals: Pulse 56 respirations 18 BP 142/76 O2 sat 97% temp 97.6  Ventilator Settings 20% ATC  . General: Comfortable at this time . Eyes: Grossly normal lids, irises & conjunctiva . ENT: grossly tongue is normal . Neck: no obvious mass . Cardiovascular: S1 S2 normal no gallop . Respiratory: No rales or rhonchi noted . Abdomen: soft . Skin: no rash seen on limited exam . Musculoskeletal: not rigid . Psychiatric:unable to assess . Neurologic: no seizure no involuntary movements         Lab Data:   Basic Metabolic Panel: Recent Labs  Lab 09/17/18 0857 09/19/18 0614 09/20/18 1101 09/21/18 0702 09/22/18 0718 09/23/18 0005  NA 146* 150* 148* 148* 145 145  K 3.9 3.9 3.9 3.3* 3.9 3.5  CL 112* 116* 112* 112* 112* 110  CO2 23 24 24 26  21* 27  GLUCOSE 155* 99 104* 101* 97 93  BUN 40* 41* 34* 28* 24* 22  CREATININE 1.20 1.05 0.89 1.15 0.94 0.94  CALCIUM 9.7 9.9 9.5 9.3 9.1 9.2  MG 2.4 2.6* 2.3 2.5* 2.4 2.2  PHOS 4.0  --  4.1 4.3 3.7 3.2    ABG: No results for input(s): PHART, PCO2ART, PO2ART, HCO3, O2SAT in the last 168 hours.  Liver Function Tests: Recent Labs  Lab 09/17/18 0857 09/19/18 0614  AST 39 40  ALT 92* 77*  ALKPHOS 93 84  BILITOT 1.5* 1.8*  PROT 7.9 7.4  ALBUMIN 3.4* 2.9*   No results for input(s): LIPASE, AMYLASE in the last 168 hours. No results for input(s): AMMONIA in the last 168 hours.  CBC: Recent Labs  Lab 09/17/18 0857 09/19/18 0614 09/20/18 1101 09/22/18 0718  09/23/18 0005  WBC 24.8* 16.0* 11.9* 11.2* 12.3*  NEUTROABS 22.1*  --   --   --   --   HGB 14.8 14.7 15.8 14.1 13.3  HCT 45.6 45.8 48.8 43.5 40.9  MCV 88.5 92.2 89.4 91.8 90.3  PLT 669* 425* 427* 360 394    Cardiac Enzymes: No results for input(s): CKTOTAL, CKMB, CKMBINDEX, TROPONINI in the last 168 hours.  BNP (last 3 results) No results for input(s): BNP in the last 8760 hours.  ProBNP (last 3 results) No results for input(s): PROBNP in the last 8760 hours.  Radiological Exams: No results found.  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 Acute on chronic respiratory failure with hypoxia we will continue with T collar trials titrate oxygen continue pulmonary toilet. 2. Acute vertebrobasilar stroke continue with present management remains unchanged 3. Tracheostomy weaning on T collar secretions are still significant 4. Retained tracheal secretions as above continue aggressive pulmonary toilet   I have personally seen and evaluated the patient, evaluated laboratory and imaging results, formulated the assessment and plan and placed orders. The Patient requires high complexity decision making for assessment and support.  Case was  discussed on Rounds with the Respiratory Therapy Staff  Allyne Gee, MD [facility] Pulmonary Critical Care Medicine Sleep Medicine

## 2018-09-24 DIAGNOSIS — Z930 Tracheostomy status: Secondary | ICD-10-CM | POA: Diagnosis not present

## 2018-09-24 DIAGNOSIS — J398 Other specified diseases of upper respiratory tract: Secondary | ICD-10-CM | POA: Diagnosis not present

## 2018-09-24 DIAGNOSIS — I6329 Cerebral infarction due to unspecified occlusion or stenosis of other precerebral arteries: Secondary | ICD-10-CM | POA: Diagnosis not present

## 2018-09-24 DIAGNOSIS — J9621 Acute and chronic respiratory failure with hypoxia: Secondary | ICD-10-CM | POA: Diagnosis not present

## 2018-09-24 NOTE — Progress Notes (Signed)
Pulmonary [place]   PULMONARY CRITICAL CARE SERVICE  PROGRESS NOTE  Date of Service: 09/24/2018  Jie Stickels  QQV:[id number]  DOB: [birth year]   DOA: 09/16/2018  Referring Physician: Merton Border, MD  HPI: Adam Jimenez is a 63 y.o. male seen for follow up of Acute on Chronic Respiratory Failure.  Patient currently is on T collar has been on 28% FiO2 good saturations are noted  Medications: Reviewed on Rounds  Physical Exam:  Vitals: Temperature 97.6 pulse 80 respiratory rate 18 blood pressure 146/83 saturations 98%  Ventilator Settings on T collar now 28% FiO2  . General: Comfortable at this time . Eyes: Grossly normal lids, irises & conjunctiva . ENT: grossly tongue is normal . Neck: no obvious mass . Cardiovascular: S1 S2 normal no gallop . Respiratory: No rhonchi no rales . Abdomen: soft . Skin: no rash seen on limited exam . Musculoskeletal: not rigid . Psychiatric:unable to assess . Neurologic: no seizure no involuntary movements         Lab Data:   Basic Metabolic Panel: Recent Labs  Lab 09/19/18 0614 09/20/18 1101 09/21/18 0702 09/22/18 0718 09/23/18 0005  NA 150* 148* 148* 145 145  K 3.9 3.9 3.3* 3.9 3.5  CL 116* 112* 112* 112* 110  CO2 24 24 26  21* 27  GLUCOSE 99 104* 101* 97 93  BUN 41* 34* 28* 24* 22  CREATININE 1.05 0.89 1.15 0.94 0.94  CALCIUM 9.9 9.5 9.3 9.1 9.2  MG 2.6* 2.3 2.5* 2.4 2.2  PHOS  --  4.1 4.3 3.7 3.2    ABG: No results for input(s): PHART, PCO2ART, PO2ART, HCO3, O2SAT in the last 168 hours.  Liver Function Tests: Recent Labs  Lab 09/19/18 0614  AST 40  ALT 77*  ALKPHOS 84  BILITOT 1.8*  PROT 7.4  ALBUMIN 2.9*   No results for input(s): LIPASE, AMYLASE in the last 168 hours. No results for input(s): AMMONIA in the last 168 hours.  CBC: Recent Labs  Lab 09/19/18 0614 09/20/18 1101 09/22/18 0718 09/23/18 0005  WBC 16.0* 11.9* 11.2* 12.3*  HGB 14.7 15.8 14.1 13.3   HCT 45.8 48.8 43.5 40.9  MCV 92.2 89.4 91.8 90.3  PLT 425* 427* 360 394    Cardiac Enzymes: No results for input(s): CKTOTAL, CKMB, CKMBINDEX, TROPONINI in the last 168 hours.  BNP (last 3 results) No results for input(s): BNP in the last 8760 hours.  ProBNP (last 3 results) No results for input(s): PROBNP in the last 8760 hours.  Radiological Exams: No results found.  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 Acute on chronic respiratory failure with hypoxia we will continue with T collar trials titrate oxygen continue pulmonary toilet. 2. Acute stroke at baseline continue supportive care 3. Tracheostomy remains in place secretions moderate 4. Retained tracheal secretions continue with aggressive pulmonary toilet   I have personally seen and evaluated the patient, evaluated laboratory and imaging results, formulated the assessment and plan and placed orders. The Patient requires high complexity decision making for assessment and support.  Case was discussed on Rounds with the Respiratory Therapy Staff  Allyne Gee, MD [facility] Pulmonary Critical Care Medicine Sleep Medicine

## 2018-09-25 DIAGNOSIS — J398 Other specified diseases of upper respiratory tract: Secondary | ICD-10-CM | POA: Diagnosis not present

## 2018-09-25 DIAGNOSIS — Z930 Tracheostomy status: Secondary | ICD-10-CM | POA: Diagnosis not present

## 2018-09-25 DIAGNOSIS — I6329 Cerebral infarction due to unspecified occlusion or stenosis of other precerebral arteries: Secondary | ICD-10-CM | POA: Diagnosis not present

## 2018-09-25 DIAGNOSIS — J9621 Acute and chronic respiratory failure with hypoxia: Secondary | ICD-10-CM | POA: Diagnosis not present

## 2018-09-25 LAB — CBC
HCT: 42.4 % (ref 39.0–52.0)
Hemoglobin: 13.6 g/dL (ref 13.0–17.0)
MCH: 29 pg (ref 26.0–34.0)
MCHC: 32.1 g/dL (ref 30.0–36.0)
MCV: 90.4 fL (ref 80.0–100.0)
Platelets: 349 10*3/uL (ref 150–400)
RBC: 4.69 MIL/uL (ref 4.22–5.81)
RDW: 12.6 % (ref 11.5–15.5)
WBC: 12.6 10*3/uL — ABNORMAL HIGH (ref 4.0–10.5)
nRBC: 0 % (ref 0.0–0.2)

## 2018-09-25 LAB — BASIC METABOLIC PANEL
Anion gap: 9 (ref 5–15)
BUN: 16 mg/dL (ref 8–23)
CO2: 27 mmol/L (ref 22–32)
Calcium: 9.4 mg/dL (ref 8.9–10.3)
Chloride: 105 mmol/L (ref 98–111)
Creatinine, Ser: 0.93 mg/dL (ref 0.61–1.24)
GFR calc Af Amer: >60 mL/min (ref >60)
GFR calc non Af Amer: >60 mL/min (ref >60)
Glucose, Bld: 96 mg/dL (ref 70–99)
Potassium: 3.9 mmol/L (ref 3.5–5.1)
Sodium: 141 mmol/L (ref 135–145)

## 2018-09-25 LAB — MAGNESIUM: Magnesium: 2.2 mg/dL (ref 1.7–2.4)

## 2018-09-25 LAB — PHOSPHORUS: Phosphorus: 4 mg/dL (ref 2.5–4.6)

## 2018-09-25 NOTE — Progress Notes (Signed)
Pulmonary [place]   PULMONARY CRITICAL CARE SERVICE  PROGRESS NOTE  Date of Service: 09/25/2018  Adam Jimenez  [id number]  DOB: 1956/02/11   DOA: 09/16/2018  Referring Physician: Merton Border, MD  HPI: Adam Jimenez is a 63 y.o. male seen for follow up of Acute on Chronic Respiratory Failure.  Patient is on T collar right now has been on 28% FiO2 tolerating the PMV  Medications: Reviewed on Rounds  Physical Exam:  Vitals: Temperature 97.0 pulse 50 respiratory rate 13 blood pressure 126/75 saturations 99%  Ventilator Settings currently on T collar on 28% FiO2 with PMV  . General: Comfortable at this time . Eyes: Grossly normal lids, irises & conjunctiva . ENT: grossly tongue is normal . Neck: no obvious mass . Cardiovascular: S1 S2 normal no gallop . Respiratory: No rhonchi no rales are noted at this time . Abdomen: soft . Skin: no rash seen on limited exam . Musculoskeletal: not rigid . Psychiatric:unable to assess . Neurologic: no seizure no involuntary movements         Lab Data:   Basic Metabolic Panel: Recent Labs  Lab 09/20/18 1101 09/21/18 0702 09/22/18 0718 09/23/18 0005 09/25/18 0804  NA 148* 148* 145 145 141  K 3.9 3.3* 3.9 3.5 3.9  CL 112* 112* 112* 110 105  CO2 24 26 21* 27 27  GLUCOSE 104* 101* 97 93 96  BUN 34* 28* 24* 22 16  CREATININE 0.89 1.15 0.94 0.94 0.93  CALCIUM 9.5 9.3 9.1 9.2 9.4  MG 2.3 2.5* 2.4 2.2 2.2  PHOS 4.1 4.3 3.7 3.2 4.0    ABG: No results for input(s): PHART, PCO2ART, PO2ART, HCO3, O2SAT in the last 168 hours.  Liver Function Tests: Recent Labs  Lab 09/19/18 0614  AST 40  ALT 77*  ALKPHOS 84  BILITOT 1.8*  PROT 7.4  ALBUMIN 2.9*   No results for input(s): LIPASE, AMYLASE in the last 168 hours. No results for input(s): AMMONIA in the last 168 hours.  CBC: Recent Labs  Lab 09/19/18 0614 09/20/18 1101 09/22/18 0718 09/23/18 0005 09/25/18 0804  WBC 16.0* 11.9*  11.2* 12.3* 12.6*  HGB 14.7 15.8 14.1 13.3 13.6  HCT 45.8 48.8 43.5 40.9 42.4  MCV 92.2 89.4 91.8 90.3 90.4  PLT 425* 427* 360 394 349    Cardiac Enzymes: No results for input(s): CKTOTAL, CKMB, CKMBINDEX, TROPONINI in the last 168 hours.  BNP (last 3 results) No results for input(s): BNP in the last 8760 hours.  ProBNP (last 3 results) No results for input(s): PROBNP in the last 8760 hours.  Radiological Exams: No results found.  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 Acute on chronic respiratory failure with hypoxia we will continue with T collar trials titrate oxygen continue pulmonary toilet. 2. Acute stroke unchanged we will continue present management 3. Tracheostomy remains in place 4. Retained secretions excessive still requiring frequent suctioning   I have personally seen and evaluated the patient, evaluated laboratory and imaging results, formulated the assessment and plan and placed orders. The Patient requires high complexity decision making for assessment and support.  Case was discussed on Rounds with the Respiratory Therapy Staff  Allyne Gee, MD [facility] Pulmonary Critical Care Medicine Sleep Medicine

## 2018-09-26 DIAGNOSIS — J398 Other specified diseases of upper respiratory tract: Secondary | ICD-10-CM | POA: Diagnosis not present

## 2018-09-26 DIAGNOSIS — I6329 Cerebral infarction due to unspecified occlusion or stenosis of other precerebral arteries: Secondary | ICD-10-CM | POA: Diagnosis not present

## 2018-09-26 DIAGNOSIS — Z930 Tracheostomy status: Secondary | ICD-10-CM | POA: Diagnosis not present

## 2018-09-26 DIAGNOSIS — J9621 Acute and chronic respiratory failure with hypoxia: Secondary | ICD-10-CM | POA: Diagnosis not present

## 2018-09-26 NOTE — Progress Notes (Addendum)
Pulmonary [place]   PULMONARY CRITICAL CARE SERVICE  PROGRESS NOTE  Date of Service: 09/26/2018  Adam Jimenez  [id number]:[id number]  DOB: [birth year]   DOA: 09/16/2018  Referring Physician: Merton Border, MD  HPI: Adam Jimenez is a 63 y.o. male seen for follow up of Acute on Chronic Respiratory Failure.  Patient continues on aerosol trach collar 28% FiO2 using PMV without difficulty.  No acute stress noted at this time.  Medications: Reviewed on Rounds  Physical Exam:  Vitals: Pulse 61 respirations 14 BP 120/78 O2 sat 98% temp 98.4  Ventilator Settings ATC 28%  . General: Comfortable at this time . Eyes: Grossly normal lids, irises & conjunctiva . ENT: grossly tongue is normal . Neck: no obvious mass . Cardiovascular: S1 S2 normal no gallop . Respiratory: No rales or rhonchi noted . Abdomen: soft . Skin: no rash seen on limited exam . Musculoskeletal: not rigid . Psychiatric:unable to assess . Neurologic: no seizure no involuntary movements         Lab Data:   Basic Metabolic Panel: Recent Labs  Lab 09/20/18 1101 09/21/18 0702 09/22/18 0718 09/23/18 0005 09/25/18 0804  NA 148* 148* 145 145 141  K 3.9 3.3* 3.9 3.5 3.9  CL 112* 112* 112* 110 105  CO2 24 26 21* 27 27  GLUCOSE 104* 101* 97 93 96  BUN 34* 28* 24* 22 16  CREATININE 0.89 1.15 0.94 0.94 0.93  CALCIUM 9.5 9.3 9.1 9.2 9.4  MG 2.3 2.5* 2.4 2.2 2.2  PHOS 4.1 4.3 3.7 3.2 4.0    ABG: No results for input(s): PHART, PCO2ART, PO2ART, HCO3, O2SAT in the last 168 hours.  Liver Function Tests: No results for input(s): AST, ALT, ALKPHOS, BILITOT, PROT, ALBUMIN in the last 168 hours. No results for input(s): LIPASE, AMYLASE in the last 168 hours. No results for input(s): AMMONIA in the last 168 hours.  CBC: Recent Labs  Lab 09/20/18 1101 09/22/18 0718 09/23/18 0005 09/25/18 0804  WBC 11.9* 11.2* 12.3* 12.6*  HGB 15.8 14.1 13.3 13.6  HCT 48.8 43.5 40.9 42.4   MCV 89.4 91.8 90.3 90.4  PLT 427* 360 394 349    Cardiac Enzymes: No results for input(s): CKTOTAL, CKMB, CKMBINDEX, TROPONINI in the last 168 hours.  BNP (last 3 results) No results for input(s): BNP in the last 8760 hours.  ProBNP (last 3 results) No results for input(s): PROBNP in the last 8760 hours.  Radiological Exams: No results found.  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 Acute on chronic respiratory failure with hypoxia we will continue with T collar trials titrate oxygen continue pulmonary toilet. 2. Acute stroke unchanged we will continue present management 3. Tracheostomy remains in place 4. Retained secretions excessive still requiring frequent suctioning   I have personally seen and evaluated the patient, evaluated laboratory and imaging results, formulated the assessment and plan and placed orders. The Patient requires high complexity decision making for assessment and support.  Case was discussed on Rounds with the Respiratory Therapy Staff  Allyne Gee, MD [facility] Pulmonary Critical Care Medicine Sleep Medicine

## 2018-09-27 DIAGNOSIS — I6329 Cerebral infarction due to unspecified occlusion or stenosis of other precerebral arteries: Secondary | ICD-10-CM | POA: Diagnosis not present

## 2018-09-27 DIAGNOSIS — J9621 Acute and chronic respiratory failure with hypoxia: Secondary | ICD-10-CM | POA: Diagnosis not present

## 2018-09-27 DIAGNOSIS — Z930 Tracheostomy status: Secondary | ICD-10-CM | POA: Diagnosis not present

## 2018-09-27 DIAGNOSIS — J398 Other specified diseases of upper respiratory tract: Secondary | ICD-10-CM | POA: Diagnosis not present

## 2018-09-27 NOTE — Progress Notes (Addendum)
Pulmonary [place]   PULMONARY CRITICAL CARE SERVICE  PROGRESS NOTE  Date of Service: 09/27/2018  Adam Jimenez  [id number]:[id number]  DOB: November 24, 1955   DOA: 09/16/2018  Referring Physician: Merton Border, MD  HPI: Adam Jimenez is a 63 y.o. male seen for follow up of Acute on Chronic Respiratory Failure.  Continues on aerosol trach collar 28% FiO2 using PMV without difficulty satting well no distress.  Medications: Reviewed on Rounds  Physical Exam:  Vitals: Pulse 47 respirations 16 BP 148/73 O2 sat 100% temp 97.1  Ventilator Settings ATC 28%  . General: Comfortable at this time . Eyes: Grossly normal lids, irises & conjunctiva . ENT: grossly tongue is normal . Neck: no obvious mass . Cardiovascular: S1 S2 normal no gallop . Respiratory: No rales or rhonchi noted . Abdomen: soft . Skin: no rash seen on limited exam . Musculoskeletal: not rigid . Psychiatric:unable to assess . Neurologic: no seizure no involuntary movements         Lab Data:   Basic Metabolic Panel: Recent Labs  Lab 09/21/18 0702 09/22/18 0718 09/23/18 0005 09/25/18 0804  NA 148* 145 145 141  K 3.3* 3.9 3.5 3.9  CL 112* 112* 110 105  CO2 26 21* 27 27  GLUCOSE 101* 97 93 96  BUN 28* 24* 22 16  CREATININE 1.15 0.94 0.94 0.93  CALCIUM 9.3 9.1 9.2 9.4  MG 2.5* 2.4 2.2 2.2  PHOS 4.3 3.7 3.2 4.0    ABG: No results for input(s): PHART, PCO2ART, PO2ART, HCO3, O2SAT in the last 168 hours.  Liver Function Tests: No results for input(s): AST, ALT, ALKPHOS, BILITOT, PROT, ALBUMIN in the last 168 hours. No results for input(s): LIPASE, AMYLASE in the last 168 hours. No results for input(s): AMMONIA in the last 168 hours.  CBC: Recent Labs  Lab 09/22/18 0718 09/23/18 0005 09/25/18 0804  WBC 11.2* 12.3* 12.6*  HGB 14.1 13.3 13.6  HCT 43.5 40.9 42.4  MCV 91.8 90.3 90.4  PLT 360 394 349    Cardiac Enzymes: No results for input(s): CKTOTAL, CKMB,  CKMBINDEX, TROPONINI in the last 168 hours.  BNP (last 3 results) No results for input(s): BNP in the last 8760 hours.  ProBNP (last 3 results) No results for input(s): PROBNP in the last 8760 hours.  Radiological Exams: No results found.  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 Acute on chronic respiratory failure with hypoxia we will continue with T collar at 28% using PMV.  Continue pulmonary toilet and supportive measures. 2. Acute stroke unchanged we will continue present management 3. Tracheostomy remains in place 4. Retained secretions excessive still requiring frequent suctioning   I have personally seen and evaluated the patient, evaluated laboratory and imaging results, formulated the assessment and plan and placed orders. The Patient requires high complexity decision making for assessment and support.  Case was discussed on Rounds with the Respiratory Therapy Staff  Allyne Gee, MD [facility] Pulmonary Critical Care Medicine Sleep Medicine

## 2018-09-28 DIAGNOSIS — J398 Other specified diseases of upper respiratory tract: Secondary | ICD-10-CM | POA: Diagnosis not present

## 2018-09-28 DIAGNOSIS — J9621 Acute and chronic respiratory failure with hypoxia: Secondary | ICD-10-CM | POA: Diagnosis not present

## 2018-09-28 DIAGNOSIS — Z930 Tracheostomy status: Secondary | ICD-10-CM | POA: Diagnosis not present

## 2018-09-28 DIAGNOSIS — I6329 Cerebral infarction due to unspecified occlusion or stenosis of other precerebral arteries: Secondary | ICD-10-CM | POA: Diagnosis not present

## 2018-09-28 LAB — CBC
HCT: 41.5 % (ref 39.0–52.0)
Hemoglobin: 13.5 g/dL (ref 13.0–17.0)
MCH: 29 pg (ref 26.0–34.0)
MCHC: 32.5 g/dL (ref 30.0–36.0)
MCV: 89.2 fL (ref 80.0–100.0)
Platelets: 305 10*3/uL (ref 150–400)
RBC: 4.65 MIL/uL (ref 4.22–5.81)
RDW: 12.9 % (ref 11.5–15.5)
WBC: 8.3 10*3/uL (ref 4.0–10.5)
nRBC: 0 % (ref 0.0–0.2)

## 2018-09-28 LAB — BASIC METABOLIC PANEL
Anion gap: 12 (ref 5–15)
BUN: 18 mg/dL (ref 8–23)
CO2: 25 mmol/L (ref 22–32)
Calcium: 9.4 mg/dL (ref 8.9–10.3)
Chloride: 106 mmol/L (ref 98–111)
Creatinine, Ser: 1.03 mg/dL (ref 0.61–1.24)
GFR calc Af Amer: >60 mL/min (ref >60)
GFR calc non Af Amer: >60 mL/min (ref >60)
Glucose, Bld: 100 mg/dL — ABNORMAL HIGH (ref 70–99)
Potassium: 3.8 mmol/L (ref 3.5–5.1)
Sodium: 143 mmol/L (ref 135–145)

## 2018-09-28 LAB — MAGNESIUM: Magnesium: 2.2 mg/dL (ref 1.7–2.4)

## 2018-09-28 NOTE — Progress Notes (Signed)
Pulmonary [place]   PULMONARY CRITICAL CARE SERVICE  PROGRESS NOTE  Date of Service: 09/28/2018  Adam Jimenez  [id number]:[id number]  DOB: [birth year]   DOA: 09/16/2018  Referring Physician: Merton Border, MD  HPI: Adam Jimenez is a 63 y.o. male seen for follow up of Acute on Chronic Respiratory Failure.  Patient currently is on T collar has been on 28% FiO2 has been tolerating PMV  Medications: Reviewed on Rounds  Physical Exam:  Vitals: Temperature 97.0 pulse 56 respiratory rate 17 blood pressure 124/72 saturations 96%  Ventilator Settings the ventilator on T collar 28% FiO2  . General: Comfortable at this time . Eyes: Grossly normal lids, irises & conjunctiva . ENT: grossly tongue is normal . Neck: no obvious mass . Cardiovascular: S1 S2 normal no gallop . Respiratory: No rhonchi no rales are noted . Abdomen: soft . Skin: no rash seen on limited exam . Musculoskeletal: not rigid . Psychiatric:unable to assess . Neurologic: no seizure no involuntary movements         Lab Data:   Basic Metabolic Panel: Recent Labs  Lab 09/22/18 0718 09/23/18 0005 09/25/18 0804 09/28/18 0842  NA 145 145 141 143  K 3.9 3.5 3.9 3.8  CL 112* 110 105 106  CO2 21* 27 27 25   GLUCOSE 97 93 96 100*  BUN 24* 22 16 18   CREATININE 0.94 0.94 0.93 1.03  CALCIUM 9.1 9.2 9.4 9.4  MG 2.4 2.2 2.2 2.2  PHOS 3.7 3.2 4.0  --     ABG: No results for input(s): PHART, PCO2ART, PO2ART, HCO3, O2SAT in the last 168 hours.  Liver Function Tests: No results for input(s): AST, ALT, ALKPHOS, BILITOT, PROT, ALBUMIN in the last 168 hours. No results for input(s): LIPASE, AMYLASE in the last 168 hours. No results for input(s): AMMONIA in the last 168 hours.  CBC: Recent Labs  Lab 09/22/18 0718 09/23/18 0005 09/25/18 0804 09/28/18 0842  WBC 11.2* 12.3* 12.6* 8.3  HGB 14.1 13.3 13.6 13.5  HCT 43.5 40.9 42.4 41.5  MCV 91.8 90.3 90.4 89.2  PLT 360 394 349 305     Cardiac Enzymes: No results for input(s): CKTOTAL, CKMB, CKMBINDEX, TROPONINI in the last 168 hours.  BNP (last 3 results) No results for input(s): BNP in the last 8760 hours.  ProBNP (last 3 results) No results for input(s): PROBNP in the last 8760 hours.  Radiological Exams: No results found.  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 Acute on chronic respiratory failure with hypoxia we will continue with T collar trials right now on 20% FiO2 we will titrate up as tolerated continue aggressive pulmonary toilet 2. Acute stroke grossly unchanged we will continue with supportive care 3. Tracheostomy remains in place patient has had copious secretions 4. Retained secretions continue aggressive pulmonary toilet   I have personally seen and evaluated the patient, evaluated laboratory and imaging results, formulated the assessment and plan and placed orders. The Patient requires high complexity decision making for assessment and support.  Case was discussed on Rounds with the Respiratory Therapy Staff  Allyne Gee, MD [facility] Pulmonary Critical Care Medicine Sleep Medicine

## 2018-09-29 DIAGNOSIS — Z930 Tracheostomy status: Secondary | ICD-10-CM | POA: Diagnosis not present

## 2018-09-29 DIAGNOSIS — J9621 Acute and chronic respiratory failure with hypoxia: Secondary | ICD-10-CM | POA: Diagnosis not present

## 2018-09-29 DIAGNOSIS — J398 Other specified diseases of upper respiratory tract: Secondary | ICD-10-CM | POA: Diagnosis not present

## 2018-09-29 DIAGNOSIS — I6329 Cerebral infarction due to unspecified occlusion or stenosis of other precerebral arteries: Secondary | ICD-10-CM | POA: Diagnosis not present

## 2018-09-29 NOTE — Progress Notes (Addendum)
Pulmonary [place]   PULMONARY CRITICAL CARE SERVICE  PROGRESS NOTE  Date of Service: 09/29/2018  Adam Jimenez  [id number]  DOB: 05-Mar-1956   DOA: 09/16/2018  Referring Physician: Merton Border, MD  HPI: Adam Jimenez is a 63 y.o. male seen for follow up of Acute on Chronic Respiratory Failure.  Patient mains on aerosol trach collar 28% FiO2 using PMV with no difficulty this time.  Medications: Reviewed on Rounds  Physical Exam:  Vitals: Pulse 56 respirations 18 BP 120/64 O2 sat 97% temp 96.2  Ventilator Settings ATC 28%  . General: Comfortable at this time . Eyes: Grossly normal lids, irises & conjunctiva . ENT: grossly tongue is normal . Neck: no obvious mass . Cardiovascular: S1 S2 normal no gallop . Respiratory: No rales or rhonchi noted . Abdomen: soft . Skin: no rash seen on limited exam . Musculoskeletal: not rigid . Psychiatric:unable to assess . Neurologic: no seizure no involuntary movements         Lab Data:   Basic Metabolic Panel: Recent Labs  Lab 09/23/18 0005 09/25/18 0804 09/28/18 0842  NA 145 141 143  K 3.5 3.9 3.8  CL 110 105 106  CO2 27 27 25   GLUCOSE 93 96 100*  BUN 22 16 18   CREATININE 0.94 0.93 1.03  CALCIUM 9.2 9.4 9.4  MG 2.2 2.2 2.2  PHOS 3.2 4.0  --     ABG: No results for input(s): PHART, PCO2ART, PO2ART, HCO3, O2SAT in the last 168 hours.  Liver Function Tests: No results for input(s): AST, ALT, ALKPHOS, BILITOT, PROT, ALBUMIN in the last 168 hours. No results for input(s): LIPASE, AMYLASE in the last 168 hours. No results for input(s): AMMONIA in the last 168 hours.  CBC: Recent Labs  Lab 09/23/18 0005 09/25/18 0804 09/28/18 0842  WBC 12.3* 12.6* 8.3  HGB 13.3 13.6 13.5  HCT 40.9 42.4 41.5  MCV 90.3 90.4 89.2  PLT 394 349 305    Cardiac Enzymes: No results for input(s): CKTOTAL, CKMB, CKMBINDEX, TROPONINI in the last 168 hours.  BNP (last 3 results) No results for  input(s): BNP in the last 8760 hours.  ProBNP (last 3 results) No results for input(s): PROBNP in the last 8760 hours.  Radiological Exams: No results found.  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 Acute on chronic respiratory failure with hypoxia we will continue with T collar trials right now on 28% FiO2 continue pulmonary toilet supportive measures. 2. Acute stroke grossly unchanged we will continue with supportive care 3. Tracheostomy remains in place patient has had copious secretions 4. Retained secretions continue aggressive pulmonary toilet   I have personally seen and evaluated the patient, evaluated laboratory and imaging results, formulated the assessment and plan and placed orders. The Patient requires high complexity decision making for assessment and support.  Case was discussed on Rounds with the Respiratory Therapy Staff  Allyne Gee, MD [facility] Pulmonary Critical Care Medicine Sleep Medicine

## 2018-09-30 DIAGNOSIS — J9621 Acute and chronic respiratory failure with hypoxia: Secondary | ICD-10-CM | POA: Diagnosis not present

## 2018-09-30 DIAGNOSIS — I6329 Cerebral infarction due to unspecified occlusion or stenosis of other precerebral arteries: Secondary | ICD-10-CM | POA: Diagnosis not present

## 2018-09-30 DIAGNOSIS — J398 Other specified diseases of upper respiratory tract: Secondary | ICD-10-CM | POA: Diagnosis not present

## 2018-09-30 DIAGNOSIS — Z930 Tracheostomy status: Secondary | ICD-10-CM | POA: Diagnosis not present

## 2018-09-30 NOTE — Progress Notes (Addendum)
Pulmonary Critical Care Medicine [place]   PULMONARY CRITICAL CARE SERVICE  PROGRESS NOTE  Date of Service: 09/30/2018  Tiffany Talarico  [id number]  DOB: 08-Aug-1955   DOA: 09/16/2018  Referring Physician: Merton Border, MD  HPI: Adam Jimenez is a 63 y.o. male seen for follow up of Acute on Chronic Respiratory Failure.  Patient continues on aerosol trach collar 28% FiO2 using PMV without occultly.  Medications: Reviewed on Rounds  Physical Exam:  Vitals: Pulse 54 respirations 20 BP 104/85 O2 sat 97% temp 96.0  Ventilator Settings ATC 28%  . General: Comfortable at this time . Eyes: Grossly normal lids, irises & conjunctiva . ENT: grossly tongue is normal . Neck: no obvious mass . Cardiovascular: S1 S2 normal no gallop . Respiratory: No rales or rhonchi noted . Abdomen: soft . Skin: no rash seen on limited exam . Musculoskeletal: not rigid . Psychiatric:unable to assess . Neurologic: no seizure no involuntary movements         Lab Data:   Basic Metabolic Panel: Recent Labs  Lab 09/25/18 0804 09/28/18 0842  NA 141 143  K 3.9 3.8  CL 105 106  CO2 27 25  GLUCOSE 96 100*  BUN 16 18  CREATININE 0.93 1.03  CALCIUM 9.4 9.4  MG 2.2 2.2  PHOS 4.0  --     ABG: No results for input(s): PHART, PCO2ART, PO2ART, HCO3, O2SAT in the last 168 hours.  Liver Function Tests: No results for input(s): AST, ALT, ALKPHOS, BILITOT, PROT, ALBUMIN in the last 168 hours. No results for input(s): LIPASE, AMYLASE in the last 168 hours. No results for input(s): AMMONIA in the last 168 hours.  CBC: Recent Labs  Lab 09/25/18 0804 09/28/18 0842  WBC 12.6* 8.3  HGB 13.6 13.5  HCT 42.4 41.5  MCV 90.4 89.2  PLT 349 305    Cardiac Enzymes: No results for input(s): CKTOTAL, CKMB, CKMBINDEX, TROPONINI in the last 168 hours.  BNP (last 3 results) No results for input(s): BNP in the last 8760 hours.  ProBNP (last 3 results) No results for input(s): PROBNP in  the last 8760 hours.  Radiological Exams: No results found.  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 Acute on chronic respiratory failure with hypoxia we will continue with T collar trials right now on 28% FiO2 continue pulmonary toilet supportive measures. 2. Acute stroke grossly unchanged we will continue with supportive care 3. Tracheostomy remains in place patient has had copious secretions 4. Retained secretions continue aggressive pulmonary toilet   I have personally seen and evaluated the patient, evaluated laboratory and imaging results, formulated the assessment and plan and placed orders. The Patient requires high complexity decision making for assessment and support.  Case was discussed on Rounds with the Respiratory Therapy Staff  Allyne Gee, MD [facility] Pulmonary Critical Care Medicine Sleep Medicine

## 2018-10-01 DIAGNOSIS — Z930 Tracheostomy status: Secondary | ICD-10-CM | POA: Diagnosis not present

## 2018-10-01 DIAGNOSIS — J398 Other specified diseases of upper respiratory tract: Secondary | ICD-10-CM | POA: Diagnosis not present

## 2018-10-01 DIAGNOSIS — I6329 Cerebral infarction due to unspecified occlusion or stenosis of other precerebral arteries: Secondary | ICD-10-CM | POA: Diagnosis not present

## 2018-10-01 DIAGNOSIS — J9621 Acute and chronic respiratory failure with hypoxia: Secondary | ICD-10-CM | POA: Diagnosis not present

## 2018-10-01 NOTE — Progress Notes (Addendum)
Pulmonary Critical Care Medicine [place]   PULMONARY CRITICAL CARE SERVICE  PROGRESS NOTE  Date of Service: 10/01/2018  Adam Jimenez  [id number]  DOB: 21-Jan-1956   DOA: 09/16/2018  Referring Physician: Merton Border, MD  HPI: Adam Jimenez is a 63 y.o. male seen for follow up of Acute on Chronic Respiratory Failure.  Patient continues on 20% aerosol trach collar satting well with no distress.  Medications: Reviewed on Rounds  Physical Exam:  Vitals: Pulse 61 respirations 17 BP 123/71 O2 sat 98% temp 97.4  Ventilator Settings ATC 28%  . General: Comfortable at this time . Eyes: Grossly normal lids, irises & conjunctiva . ENT: grossly tongue is normal . Neck: no obvious mass . Cardiovascular: S1 S2 normal no gallop . Respiratory: No rales or rhonchi noted . Abdomen: soft . Skin: no rash seen on limited exam . Musculoskeletal: not rigid . Psychiatric:unable to assess . Neurologic: no seizure no involuntary movements         Lab Data:   Basic Metabolic Panel: Recent Labs  Lab 09/25/18 0804 09/28/18 0842  NA 141 143  K 3.9 3.8  CL 105 106  CO2 27 25  GLUCOSE 96 100*  BUN 16 18  CREATININE 0.93 1.03  CALCIUM 9.4 9.4  MG 2.2 2.2  PHOS 4.0  --     ABG: No results for input(s): PHART, PCO2ART, PO2ART, HCO3, O2SAT in the last 168 hours.  Liver Function Tests: No results for input(s): AST, ALT, ALKPHOS, BILITOT, PROT, ALBUMIN in the last 168 hours. No results for input(s): LIPASE, AMYLASE in the last 168 hours. No results for input(s): AMMONIA in the last 168 hours.  CBC: Recent Labs  Lab 09/25/18 0804 09/28/18 0842  WBC 12.6* 8.3  HGB 13.6 13.5  HCT 42.4 41.5  MCV 90.4 89.2  PLT 349 305    Cardiac Enzymes: No results for input(s): CKTOTAL, CKMB, CKMBINDEX, TROPONINI in the last 168 hours.  BNP (last 3 results) No results for input(s): BNP in the last 8760 hours.  ProBNP (last 3 results) No results for input(s): PROBNP in  the last 8760 hours.  Radiological Exams: No results found.  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 Acute on chronic respiratory failure with hypoxia we will continue with T collar trials right now on 28% FiO2continue pulmonary toilet supportive measures. 2. Acute stroke grossly unchanged we will continue with supportive care 3. Tracheostomy remains in place patient has had copious secretions 4. Retained secretions continue aggressive pulmonary toilet   I have personally seen and evaluated the patient, evaluated laboratory and imaging results, formulated the assessment and plan and placed orders. The Patient requires high complexity decision making for assessment and support.  Case was discussed on Rounds with the Respiratory Therapy Staff  Allyne Gee, MD [facility] Pulmonary Critical Care Medicine Sleep Medicine

## 2018-10-02 DIAGNOSIS — Z930 Tracheostomy status: Secondary | ICD-10-CM | POA: Diagnosis not present

## 2018-10-02 DIAGNOSIS — I6329 Cerebral infarction due to unspecified occlusion or stenosis of other precerebral arteries: Secondary | ICD-10-CM | POA: Diagnosis not present

## 2018-10-02 DIAGNOSIS — J398 Other specified diseases of upper respiratory tract: Secondary | ICD-10-CM | POA: Diagnosis not present

## 2018-10-02 DIAGNOSIS — J9621 Acute and chronic respiratory failure with hypoxia: Secondary | ICD-10-CM | POA: Diagnosis not present

## 2018-10-02 LAB — BASIC METABOLIC PANEL
Anion gap: 10 (ref 5–15)
BUN: 22 mg/dL (ref 8–23)
CO2: 27 mmol/L (ref 22–32)
Calcium: 9.4 mg/dL (ref 8.9–10.3)
Chloride: 104 mmol/L (ref 98–111)
Creatinine, Ser: 0.99 mg/dL (ref 0.61–1.24)
GFR calc Af Amer: >60 mL/min (ref >60)
GFR calc non Af Amer: >60 mL/min (ref >60)
Glucose, Bld: 91 mg/dL (ref 70–99)
Potassium: 3.7 mmol/L (ref 3.5–5.1)
Sodium: 141 mmol/L (ref 135–145)

## 2018-10-02 LAB — CBC
HCT: 44 % (ref 39.0–52.0)
Hemoglobin: 14.4 g/dL (ref 13.0–17.0)
MCH: 29.1 pg (ref 26.0–34.0)
MCHC: 32.7 g/dL (ref 30.0–36.0)
MCV: 88.9 fL (ref 80.0–100.0)
Platelets: 210 10*3/uL (ref 150–400)
RBC: 4.95 MIL/uL (ref 4.22–5.81)
RDW: 13 % (ref 11.5–15.5)
WBC: 8.3 10*3/uL (ref 4.0–10.5)
nRBC: 0 % (ref 0.0–0.2)

## 2018-10-02 LAB — MAGNESIUM: Magnesium: 2.1 mg/dL (ref 1.7–2.4)

## 2018-10-02 NOTE — Progress Notes (Signed)
Pulmonary Critical Care Medicine [place]   PULMONARY CRITICAL CARE SERVICE  PROGRESS NOTE  Date of Service: 10/02/2018  Adam Jimenez  [id number]  DOB: 01/28/1956   DOA: 09/16/2018  Referring Physician: Merton Border, MD  HPI: Adam Jimenez is a 63 y.o. male seen for follow up of Acute on Chronic Respiratory Failure.  Patient is resting comfortably without distress has been on T collar on 28% FiO2 with good saturations  Medications: Reviewed on Rounds  Physical Exam:  Vitals: Temperature 97.5 pulse 49 respiratory rate 14 blood pressure 146/78 saturations 99%  Ventilator Settings off the ventilator right now on T collar  . General: Comfortable at this time . Eyes: Grossly normal lids, irises & conjunctiva . ENT: grossly tongue is normal . Neck: no obvious mass . Cardiovascular: S1 S2 normal no gallop . Respiratory: No rhonchi no rales are noted at this time . Abdomen: soft . Skin: no rash seen on limited exam . Musculoskeletal: not rigid . Psychiatric:unable to assess . Neurologic: no seizure no involuntary movements         Lab Data:   Basic Metabolic Panel: Recent Labs  Lab 09/28/18 0842 10/02/18 0807  NA 143 141  K 3.8 3.7  CL 106 104  CO2 25 27  GLUCOSE 100* 91  BUN 18 22  CREATININE 1.03 0.99  CALCIUM 9.4 9.4  MG 2.2 2.1    ABG: No results for input(s): PHART, PCO2ART, PO2ART, HCO3, O2SAT in the last 168 hours.  Liver Function Tests: No results for input(s): AST, ALT, ALKPHOS, BILITOT, PROT, ALBUMIN in the last 168 hours. No results for input(s): LIPASE, AMYLASE in the last 168 hours. No results for input(s): AMMONIA in the last 168 hours.  CBC: Recent Labs  Lab 09/28/18 0842 10/02/18 0807  WBC 8.3 8.3  HGB 13.5 14.4  HCT 41.5 44.0  MCV 89.2 88.9  PLT 305 210    Cardiac Enzymes: No results for input(s): CKTOTAL, CKMB, CKMBINDEX, TROPONINI in the last 168 hours.  BNP (last 3 results) No results for input(s): BNP in  the last 8760 hours.  ProBNP (last 3 results) No results for input(s): PROBNP in the last 8760 hours.  Radiological Exams: No results found.  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 Acute on chronic respiratory failure with hypoxia we will continue with T collar trials as noted right now is requiring only 28% FiO2. 2. Acute ischemic stroke patient will be continued on supportive care 3. Tracheostomy remains in place 4. Retained secretions continue aggressive pulmonary toilet   I have personally seen and evaluated the patient, evaluated laboratory and imaging results, formulated the assessment and plan and placed orders. The Patient requires high complexity decision making for assessment and support.  Case was discussed on Rounds with the Respiratory Therapy Staff  Allyne Gee, MD [facility] Pulmonary Critical Care Medicine Sleep Medicine

## 2018-10-03 DIAGNOSIS — Z930 Tracheostomy status: Secondary | ICD-10-CM | POA: Diagnosis not present

## 2018-10-03 DIAGNOSIS — I6329 Cerebral infarction due to unspecified occlusion or stenosis of other precerebral arteries: Secondary | ICD-10-CM | POA: Diagnosis not present

## 2018-10-03 DIAGNOSIS — J9621 Acute and chronic respiratory failure with hypoxia: Secondary | ICD-10-CM | POA: Diagnosis not present

## 2018-10-03 DIAGNOSIS — J398 Other specified diseases of upper respiratory tract: Secondary | ICD-10-CM | POA: Diagnosis not present

## 2018-10-03 NOTE — Progress Notes (Addendum)
Pulmonary Critical Care Medicine [place]   PULMONARY CRITICAL CARE SERVICE  PROGRESS NOTE  Date of Service: 10/03/2018  Adam Jimenez  [id number]  DOB: [birth year]   DOA: 09/16/2018  Referring Physician: Merton Border, MD  HPI: Adam Jimenez is a 63 y.o. male seen for follow up of Acute on Chronic Respiratory Failure.  Patient is on aerosol trach collar 28% FiO2 using PMV with no difficulty.  Medications: Reviewed on Rounds  Physical Exam:  Vitals: Pulse 86 respirations 22 BP 124/77 O2 sat 96% for  Ventilator Settings 28% aerosol trach collar  . General: Comfortable at this time . Eyes: Grossly normal lids, irises & conjunctiva . ENT: grossly tongue is normal . Neck: no obvious mass . Cardiovascular: S1 S2 normal no gallop . Respiratory: No rales rhonchi noted . Abdomen: soft . Skin: no rash seen on limited exam . Musculoskeletal: not rigid . Psychiatric:unable to assess . Neurologic: no seizure no involuntary movements         Lab Data:   Basic Metabolic Panel: Recent Labs  Lab 09/28/18 0842 10/02/18 0807  NA 143 141  K 3.8 3.7  CL 106 104  CO2 25 27  GLUCOSE 100* 91  BUN 18 22  CREATININE 1.03 0.99  CALCIUM 9.4 9.4  MG 2.2 2.1    ABG: No results for input(s): PHART, PCO2ART, PO2ART, HCO3, O2SAT in the last 168 hours.  Liver Function Tests: No results for input(s): AST, ALT, ALKPHOS, BILITOT, PROT, ALBUMIN in the last 168 hours. No results for input(s): LIPASE, AMYLASE in the last 168 hours. No results for input(s): AMMONIA in the last 168 hours.  CBC: Recent Labs  Lab 09/28/18 0842 10/02/18 0807  WBC 8.3 8.3  HGB 13.5 14.4  HCT 41.5 44.0  MCV 89.2 88.9  PLT 305 210    Cardiac Enzymes: No results for input(s): CKTOTAL, CKMB, CKMBINDEX, TROPONINI in the last 168 hours.  BNP (last 3 results) No results for input(s): BNP in the last 8760 hours.  ProBNP (last 3 results) No results for input(s): PROBNP in the last 8760  hours.  Radiological Exams: No results found.  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 Acute on chronic respiratory failure with hypoxia we will continue with T collar trials as noted right now is requiring only 28% FiO2. 2. Acute ischemic stroke patient will be continued on supportive care 3. Tracheostomy remains in place 4. Retained secretions continue aggressive pulmonary toilet   I have personally seen and evaluated the patient, evaluated laboratory and imaging results, formulated the assessment and plan and placed orders. The Patient requires high complexity decision making for assessment and support.  Case was discussed on Rounds with the Respiratory Therapy Staff  Allyne Gee, MD [facility] Pulmonary Critical Care Medicine Sleep Medicine

## 2018-10-04 DIAGNOSIS — J9621 Acute and chronic respiratory failure with hypoxia: Secondary | ICD-10-CM | POA: Diagnosis not present

## 2018-10-04 DIAGNOSIS — Z930 Tracheostomy status: Secondary | ICD-10-CM | POA: Diagnosis not present

## 2018-10-04 DIAGNOSIS — J398 Other specified diseases of upper respiratory tract: Secondary | ICD-10-CM | POA: Diagnosis not present

## 2018-10-04 DIAGNOSIS — I6329 Cerebral infarction due to unspecified occlusion or stenosis of other precerebral arteries: Secondary | ICD-10-CM | POA: Diagnosis not present

## 2018-10-04 NOTE — Progress Notes (Addendum)
Pulmonary [place]   PULMONARY CRITICAL CARE SERVICE  PROGRESS NOTE  Date of Service: 10/04/2018  Terez Freimark  [id number]  DOB: 1955/06/25   DOA: 09/16/2018  Referring Physician: Merton Border, MD  HPI: Adam Jimenez is a 63 y.o. male seen for follow up of Acute on Chronic Respiratory Failure.  Patient continues to do well on aerosol trach collar 28% FiO2 using PMV with no difficulty.  No fever or distress noted.  Medications: Reviewed on Rounds  Physical Exam:  Vitals: Pulse 50 respirations 18 BP 142/79 O2 sat 99% temp 97 point  Ventilator Settings aerosol trach collar 28%  . General: Comfortable at this time . Eyes: Grossly normal lids, irises & conjunctiva . ENT: grossly tongue is normal . Neck: no obvious mass . Cardiovascular: S1 S2 normal no gallop . Respiratory: No rales or rhonchi noted . Abdomen: soft . Skin: no rash seen on limited exam . Musculoskeletal: not rigid . Psychiatric:unable to assess . Neurologic: no seizure no involuntary movements         Lab Data:   Basic Metabolic Panel: Recent Labs  Lab 09/28/18 0842 10/02/18 0807  NA 143 141  K 3.8 3.7  CL 106 104  CO2 25 27  GLUCOSE 100* 91  BUN 18 22  CREATININE 1.03 0.99  CALCIUM 9.4 9.4  MG 2.2 2.1    ABG: No results for input(s): PHART, PCO2ART, PO2ART, HCO3, O2SAT in the last 168 hours.  Liver Function Tests: No results for input(s): AST, ALT, ALKPHOS, BILITOT, PROT, ALBUMIN in the last 168 hours. No results for input(s): LIPASE, AMYLASE in the last 168 hours. No results for input(s): AMMONIA in the last 168 hours.  CBC: Recent Labs  Lab 09/28/18 0842 10/02/18 0807  WBC 8.3 8.3  HGB 13.5 14.4  HCT 41.5 44.0  MCV 89.2 88.9  PLT 305 210    Cardiac Enzymes: No results for input(s): CKTOTAL, CKMB, CKMBINDEX, TROPONINI in the last 168 hours.  BNP (last 3 results) No results for input(s): BNP in the last 8760 hours.  ProBNP (last 3  results) No results for input(s): PROBNP in the last 8760 hours.  Radiological Exams: No results found.  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 Acute on chronic respiratory failure with hypoxia we will continue with T collar trials as noted right now is requiring only 28% FiO2. 2. Acute ischemic stroke patient will be continued on supportive care 3. Tracheostomy remains in place 4. Retained secretions continue aggressive pulmonary toilet   I have personally seen and evaluated the patient, evaluated laboratory and imaging results, formulated the assessment and plan and placed orders. The Patient requires high complexity decision making for assessment and support.  Case was discussed on Rounds with the Respiratory Therapy Staff  Allyne Gee, MD [facility] Pulmonary Critical Care Medicine Sleep Medicine

## 2018-10-05 DIAGNOSIS — Z930 Tracheostomy status: Secondary | ICD-10-CM | POA: Diagnosis not present

## 2018-10-05 DIAGNOSIS — I6329 Cerebral infarction due to unspecified occlusion or stenosis of other precerebral arteries: Secondary | ICD-10-CM | POA: Diagnosis not present

## 2018-10-05 DIAGNOSIS — J9621 Acute and chronic respiratory failure with hypoxia: Secondary | ICD-10-CM | POA: Diagnosis not present

## 2018-10-05 DIAGNOSIS — J398 Other specified diseases of upper respiratory tract: Secondary | ICD-10-CM | POA: Diagnosis not present

## 2018-10-05 NOTE — Progress Notes (Signed)
Pulmonary Critical Care Medicine [place]   PULMONARY CRITICAL CARE SERVICE  PROGRESS NOTE  Date of Service: 10/05/2018  Adam Jimenez  [id number]  DOB: 06-12-1955   DOA: 09/16/2018  Referring Physician: Merton Border, MD  HPI: Adam Jimenez is a 63 y.o. male seen for follow up of Acute on Chronic Respiratory Failure.  Patient currently is on T collar with the PMV we should be able to downsize  Medications: Reviewed on Rounds  Physical Exam:  Vitals: Temperature 97.6 pulse 54 respiratory rate is 15 blood pressure 138/71 saturations 95%  Ventilator Settings off the ventilator on T collar right now 28% FiO2  . General: Comfortable at this time . Eyes: Grossly normal lids, irises & conjunctiva . ENT: grossly tongue is normal . Neck: no obvious mass . Cardiovascular: S1 S2 normal no gallop . Respiratory: No rhonchi no rales are noted at this time . Abdomen: soft . Skin: no rash seen on limited exam . Musculoskeletal: not rigid . Psychiatric:unable to assess . Neurologic: no seizure no involuntary movements         Lab Data:   Basic Metabolic Panel: Recent Labs  Lab 10/02/18 0807  NA 141  K 3.7  CL 104  CO2 27  GLUCOSE 91  BUN 22  CREATININE 0.99  CALCIUM 9.4  MG 2.1    ABG: No results for input(s): PHART, PCO2ART, PO2ART, HCO3, O2SAT in the last 168 hours.  Liver Function Tests: No results for input(s): AST, ALT, ALKPHOS, BILITOT, PROT, ALBUMIN in the last 168 hours. No results for input(s): LIPASE, AMYLASE in the last 168 hours. No results for input(s): AMMONIA in the last 168 hours.  CBC: Recent Labs  Lab 10/02/18 0807  WBC 8.3  HGB 14.4  HCT 44.0  MCV 88.9  PLT 210    Cardiac Enzymes: No results for input(s): CKTOTAL, CKMB, CKMBINDEX, TROPONINI in the last 168 hours.  BNP (last 3 results) No results for input(s): BNP in the last 8760 hours.  ProBNP (last 3 results) No results for input(s): PROBNP in the last 8760  hours.  Radiological Exams: No results found.  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 Acute on chronic respiratory failure with hypoxia continue with T collar trials titrate oxygen continue pulmonary toilet. 2. Acute stroke grossly unchanged we will continue with supportive care 3. Tracheostomy will be changed over to a #6 cuffless trach and then begin capping trials 4. Increase secretions continue aggressive pulmonary toilet not sure if the patient is going to be able to be decannulated eventually but will start by changing to a cuffless trach as indicated above   I have personally seen and evaluated the patient, evaluated laboratory and imaging results, formulated the assessment and plan and placed orders. The Patient requires high complexity decision making for assessment and support.  Case was discussed on Rounds with the Respiratory Therapy Staff  Allyne Gee, MD [facility] Pulmonary Critical Care Medicine Sleep Medicine

## 2018-10-06 DIAGNOSIS — I6329 Cerebral infarction due to unspecified occlusion or stenosis of other precerebral arteries: Secondary | ICD-10-CM | POA: Diagnosis not present

## 2018-10-06 DIAGNOSIS — J9621 Acute and chronic respiratory failure with hypoxia: Secondary | ICD-10-CM | POA: Diagnosis not present

## 2018-10-06 DIAGNOSIS — J398 Other specified diseases of upper respiratory tract: Secondary | ICD-10-CM | POA: Diagnosis not present

## 2018-10-06 DIAGNOSIS — Z930 Tracheostomy status: Secondary | ICD-10-CM | POA: Diagnosis not present

## 2018-10-06 LAB — CBC
HCT: 42.4 % (ref 39.0–52.0)
Hemoglobin: 14.1 g/dL (ref 13.0–17.0)
MCH: 29.7 pg (ref 26.0–34.0)
MCHC: 33.3 g/dL (ref 30.0–36.0)
MCV: 89.3 fL (ref 80.0–100.0)
Platelets: 196 10*3/uL (ref 150–400)
RBC: 4.75 MIL/uL (ref 4.22–5.81)
RDW: 13.1 % (ref 11.5–15.5)
WBC: 6.4 10*3/uL (ref 4.0–10.5)
nRBC: 0 % (ref 0.0–0.2)

## 2018-10-06 LAB — BASIC METABOLIC PANEL
Anion gap: 10 (ref 5–15)
BUN: 19 mg/dL (ref 8–23)
CO2: 27 mmol/L (ref 22–32)
Calcium: 9.7 mg/dL (ref 8.9–10.3)
Chloride: 107 mmol/L (ref 98–111)
Creatinine, Ser: 0.97 mg/dL (ref 0.61–1.24)
GFR calc Af Amer: >60 mL/min (ref >60)
GFR calc non Af Amer: >60 mL/min (ref >60)
Glucose, Bld: 95 mg/dL (ref 70–99)
Potassium: 3.6 mmol/L (ref 3.5–5.1)
Sodium: 144 mmol/L (ref 135–145)

## 2018-10-06 LAB — PHOSPHORUS: Phosphorus: 4 mg/dL (ref 2.5–4.6)

## 2018-10-06 LAB — MAGNESIUM: Magnesium: 2.2 mg/dL (ref 1.7–2.4)

## 2018-10-06 NOTE — Progress Notes (Addendum)
Pulmonary Critical Care Medicine [place]   PULMONARY CRITICAL CARE SERVICE  PROGRESS NOTE  Date of Service: 10/06/2018  Adama Ferber  [id number]  DOB: 04/01/1956   DOA: 09/16/2018  Referring Physician: Merton Border, MD  HPI: Adam Jimenez is a 63 y.o. male seen for follow up of Acute on Chronic Respiratory Failure.  Patient remains capped at this time on room air satting well with no distress.  Medications: Reviewed on Rounds  Physical Exam:  Vitals: Pulse 48 respirations 18 BP 95/76 O2 sat 99% temp 96.9  Ventilator Settings room air  . General: Comfortable at this time . Eyes: Grossly normal lids, irises & conjunctiva . ENT: grossly tongue is normal . Neck: no obvious mass . Cardiovascular: S1 S2 normal no gallop . Respiratory: No rales or rhonchi noted . Abdomen: soft . Skin: no rash seen on limited exam . Musculoskeletal: not rigid . Psychiatric:unable to assess . Neurologic: no seizure no involuntary movements         Lab Data:   Basic Metabolic Panel: Recent Labs  Lab 10/02/18 0807 10/06/18 0527  NA 141 144  K 3.7 3.6  CL 104 107  CO2 27 27  GLUCOSE 91 95  BUN 22 19  CREATININE 0.99 0.97  CALCIUM 9.4 9.7  MG 2.1 2.2  PHOS  --  4.0    ABG: No results for input(s): PHART, PCO2ART, PO2ART, HCO3, O2SAT in the last 168 hours.  Liver Function Tests: No results for input(s): AST, ALT, ALKPHOS, BILITOT, PROT, ALBUMIN in the last 168 hours. No results for input(s): LIPASE, AMYLASE in the last 168 hours. No results for input(s): AMMONIA in the last 168 hours.  CBC: Recent Labs  Lab 10/02/18 0807 10/06/18 0527  WBC 8.3 6.4  HGB 14.4 14.1  HCT 44.0 42.4  MCV 88.9 89.3  PLT 210 196    Cardiac Enzymes: No results for input(s): CKTOTAL, CKMB, CKMBINDEX, TROPONINI in the last 168 hours.  BNP (last 3 results) No results for input(s): BNP in the last 8760 hours.  ProBNP (last 3 results) No results for input(s): PROBNP in the  last 8760 hours.  Radiological Exams: No results found.  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 Acute on chronic respiratory failure with hypoxia continue with T collar trials titrate oxygen continue pulmonary toilet. 2. Acute stroke grossly unchanged we will continue with supportive care 3. Tracheostomy will be changed over to a #6 cuffless trach and then begin capping trials 4. Increase secretions continue aggressive pulmonary toilet not sure if the patient is going to be able to be decannulated eventually but will start by changing to a cuffless trach as indicated above   I have personally seen and evaluated the patient, evaluated laboratory and imaging results, formulated the assessment and plan and placed orders. The Patient requires high complexity decision making for assessment and support.  Case was discussed on Rounds with the Respiratory Therapy Staff  Allyne Gee, MD [facility] Pulmonary Critical Care Medicine Sleep Medicine

## 2018-10-07 DIAGNOSIS — Z930 Tracheostomy status: Secondary | ICD-10-CM | POA: Diagnosis not present

## 2018-10-07 DIAGNOSIS — I6329 Cerebral infarction due to unspecified occlusion or stenosis of other precerebral arteries: Secondary | ICD-10-CM | POA: Diagnosis not present

## 2018-10-07 DIAGNOSIS — J9621 Acute and chronic respiratory failure with hypoxia: Secondary | ICD-10-CM | POA: Diagnosis not present

## 2018-10-07 DIAGNOSIS — J398 Other specified diseases of upper respiratory tract: Secondary | ICD-10-CM | POA: Diagnosis not present

## 2018-10-07 LAB — CULTURE, RESPIRATORY W GRAM STAIN

## 2018-10-07 NOTE — Progress Notes (Addendum)
Pulmonary [place]   PULMONARY CRITICAL CARE SERVICE  PROGRESS NOTE  Date of Service: 10/07/2018  Adam Jimenez  LOV:[id number]  DOB: 1956/01/03   DOA: 09/16/2018  Referring Physician: Merton Border, MD  HPI: Adam Jimenez is a 63 y.o. male seen for follow up of Acute on Chronic Respiratory Failure.  Patient remains capped on room air for 48 hours.  Satting well no distress.  Medications: Reviewed on Rounds  Physical Exam:  Vitals: Pulse 61 respirations 18 BP 130/65 O2 sat 94% temp 97 point  Ventilator Settings room air  . General: Comfortable at this time . Eyes: Grossly normal lids, irises & conjunctiva . ENT: grossly tongue is normal . Neck: no obvious mass . Cardiovascular: S1 S2 normal no gallop . Respiratory: No rales or rhonchi noted . Abdomen: soft . Skin: no rash seen on limited exam . Musculoskeletal: not rigid . Psychiatric:unable to assess . Neurologic: no seizure no involuntary movements         Lab Data:   Basic Metabolic Panel: Recent Labs  Lab 10/02/18 0807 10/06/18 0527  NA 141 144  K 3.7 3.6  CL 104 107  CO2 27 27  GLUCOSE 91 95  BUN 22 19  CREATININE 0.99 0.97  CALCIUM 9.4 9.7  MG 2.1 2.2  PHOS  --  4.0    ABG: No results for input(s): PHART, PCO2ART, PO2ART, HCO3, O2SAT in the last 168 hours.  Liver Function Tests: No results for input(s): AST, ALT, ALKPHOS, BILITOT, PROT, ALBUMIN in the last 168 hours. No results for input(s): LIPASE, AMYLASE in the last 168 hours. No results for input(s): AMMONIA in the last 168 hours.  CBC: Recent Labs  Lab 10/02/18 0807 10/06/18 0527  WBC 8.3 6.4  HGB 14.4 14.1  HCT 44.0 42.4  MCV 88.9 89.3  PLT 210 196    Cardiac Enzymes: No results for input(s): CKTOTAL, CKMB, CKMBINDEX, TROPONINI in the last 168 hours.  BNP (last 3 results) No results for input(s): BNP in the last 8760 hours.  ProBNP (last 3 results) No results for input(s): PROBNP in  the last 8760 hours.  Radiological Exams: No results found.  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 Acute on chronic respiratory failure with hypoxia patient is doing well with capping on room air at this time.  Has been without vent for 48 hours.  Continue supportive measures at this time. 2. Acute stroke grossly unchanged we will continue with supportive care 3. Tracheostomy will be changed over to a #6 cuffless trach and then begin capping trials 4. Increase secretions continue aggressive pulmonary toilet not sure if the patient is going to be able to be decannulated eventually but will start by changing to a cuffless trach as indicated above.   I have personally seen and evaluated the patient, evaluated laboratory and imaging results, formulated the assessment and plan and placed orders. The Patient requires high complexity decision making for assessment and support.  Case was discussed on Rounds with the Respiratory Therapy Staff  Allyne Gee, MD [facility] Pulmonary Critical Care Medicine Sleep Medicine

## 2018-10-08 DIAGNOSIS — I6329 Cerebral infarction due to unspecified occlusion or stenosis of other precerebral arteries: Secondary | ICD-10-CM | POA: Diagnosis not present

## 2018-10-08 DIAGNOSIS — Z930 Tracheostomy status: Secondary | ICD-10-CM | POA: Diagnosis not present

## 2018-10-08 DIAGNOSIS — J398 Other specified diseases of upper respiratory tract: Secondary | ICD-10-CM | POA: Diagnosis not present

## 2018-10-08 DIAGNOSIS — J9621 Acute and chronic respiratory failure with hypoxia: Secondary | ICD-10-CM | POA: Diagnosis not present

## 2018-10-08 NOTE — Progress Notes (Signed)
Pulmonary Critical Care Medicine [place]   PULMONARY CRITICAL CARE SERVICE  PROGRESS NOTE  Date of Service: 10/08/2018  Adam Jimenez  OXB:[id number]  DOB: Apr 27, 1955   DOA: 09/16/2018  Referring Physician: Merton Border, MD  HPI: Adam Jimenez is a 63 y.o. male seen for follow up of Acute on Chronic Respiratory Failure.  Patient is capping actually doing well we should be able to proceed to decannulation today  Medications: Reviewed on Rounds  Physical Exam:  Vitals: Temperature 96.9 pulse 62 respiratory rate 20 blood pressure 111/51 saturations 93%  Ventilator Settings capping off the ventilator  . General: Comfortable at this time . Eyes: Grossly normal lids, irises & conjunctiva . ENT: grossly tongue is normal . Neck: no obvious mass . Cardiovascular: S1 S2 normal no gallop . Respiratory: No rhonchi no rales are noted at this time . Abdomen: soft . Skin: no rash seen on limited exam . Musculoskeletal: not rigid . Psychiatric:unable to assess . Neurologic: no seizure no involuntary movements         Lab Data:   Basic Metabolic Panel: Recent Labs  Lab 10/02/18 0807 10/06/18 0527  NA 141 144  K 3.7 3.6  CL 104 107  CO2 27 27  GLUCOSE 91 95  BUN 22 19  CREATININE 0.99 0.97  CALCIUM 9.4 9.7  MG 2.1 2.2  PHOS  --  4.0    ABG: No results for input(s): PHART, PCO2ART, PO2ART, HCO3, O2SAT in the last 168 hours.  Liver Function Tests: No results for input(s): AST, ALT, ALKPHOS, BILITOT, PROT, ALBUMIN in the last 168 hours. No results for input(s): LIPASE, AMYLASE in the last 168 hours. No results for input(s): AMMONIA in the last 168 hours.  CBC: Recent Labs  Lab 10/02/18 0807 10/06/18 0527  WBC 8.3 6.4  HGB 14.4 14.1  HCT 44.0 42.4  MCV 88.9 89.3  PLT 210 196    Cardiac Enzymes: No results for input(s): CKTOTAL, CKMB, CKMBINDEX, TROPONINI in the last 168 hours.  BNP (last 3 results) No results for input(s): BNP in the last  8760 hours.  ProBNP (last 3 results) No results for input(s): PROBNP in the last 8760 hours.  Radiological Exams: No results found.  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 Acute on chronic respiratory failure with hypoxia clinically improved and has been doing well with capping proceed to decannulation 2. Acute stroke at baseline therapy as tolerated 3. Tracheostomy remains in place we will continue with present management 4. Retained tracheal secretions resolved continue with supportive care pulmonary toilet   I have personally seen and evaluated the patient, evaluated laboratory and imaging results, formulated the assessment and plan and placed orders. The Patient requires high complexity decision making for assessment and support.  Case was discussed on Rounds with the Respiratory Therapy Staff  Allyne Gee, MD [facility] Pulmonary Critical Care Medicine Sleep Medicine

## 2018-10-09 ENCOUNTER — Other Ambulatory Visit (HOSPITAL_COMMUNITY): Payer: Self-pay

## 2018-10-09 DIAGNOSIS — I6329 Cerebral infarction due to unspecified occlusion or stenosis of other precerebral arteries: Secondary | ICD-10-CM | POA: Diagnosis not present

## 2018-10-09 DIAGNOSIS — J9621 Acute and chronic respiratory failure with hypoxia: Secondary | ICD-10-CM | POA: Diagnosis not present

## 2018-10-09 DIAGNOSIS — J398 Other specified diseases of upper respiratory tract: Secondary | ICD-10-CM | POA: Diagnosis not present

## 2018-10-09 DIAGNOSIS — Z930 Tracheostomy status: Secondary | ICD-10-CM | POA: Diagnosis not present

## 2018-10-09 MED ORDER — GENERIC EXTERNAL MEDICATION
Status: DC
Start: ? — End: 2018-10-09

## 2018-10-09 NOTE — Progress Notes (Addendum)
Pulmonary [place]   PULMONARY CRITICAL CARE SERVICE  PROGRESS NOTE  Date of Service: 10/09/2018  Andry Bogden  VOJ:[id number]  DOB: 10/15/1955   DOA: 09/16/2018  Referring Physician: Merton Border, MD  HPI: Adam Jimenez is a 63 y.o. male seen for follow up of Acute on Chronic Respiratory Failure.  Patient remains on room air decannulate yesterday satting well with no fever or distress noted.  Medications: Reviewed on Rounds  Physical Exam:  Vitals: Pulse 61 respirations 15 BP 132/88 O2 sat 99% temp 97.8  Ventilator Settings room air  . General: Comfortable at this time . Eyes: Grossly normal lids, irises & conjunctiva . ENT: grossly tongue is normal . Neck: no obvious mass . Cardiovascular: S1 S2 normal no gallop . Respiratory: No rales or rhonchi noted . Abdomen: soft . Skin: no rash seen on limited exam . Musculoskeletal: not rigid . Psychiatric:unable to assess . Neurologic: no seizure no involuntary movements         Lab Data:   Basic Metabolic Panel: Recent Labs  Lab 10/06/18 0527  NA 144  K 3.6  CL 107  CO2 27  GLUCOSE 95  BUN 19  CREATININE 0.97  CALCIUM 9.7  MG 2.2  PHOS 4.0    ABG: No results for input(s): PHART, PCO2ART, PO2ART, HCO3, O2SAT in the last 168 hours.  Liver Function Tests: No results for input(s): AST, ALT, ALKPHOS, BILITOT, PROT, ALBUMIN in the last 168 hours. No results for input(s): LIPASE, AMYLASE in the last 168 hours. No results for input(s): AMMONIA in the last 168 hours.  CBC: Recent Labs  Lab 10/06/18 0527  WBC 6.4  HGB 14.1  HCT 42.4  MCV 89.3  PLT 196    Cardiac Enzymes: No results for input(s): CKTOTAL, CKMB, CKMBINDEX, TROPONINI in the last 168 hours.  BNP (last 3 results) No results for input(s): BNP in the last 8760 hours.  ProBNP (last 3 results) No results for input(s): PROBNP in the last 8760 hours.  Radiological Exams: No results  found.  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 Acute on chronic respiratory failure with hypoxia clinically improved and has been doing well with capping since decannulation.  Continue supportive measures and pulmonary toilet. 2. Acute stroke at baseline therapy as tolerated 3. Tracheostomy remains in place we will continue with present management 4. Retained tracheal secretions resolved continue with supportive care pulmonary toilet   I have personally seen and evaluated the patient, evaluated laboratory and imaging results, formulated the assessment and plan and placed orders. The Patient requires high complexity decision making for assessment and support.  Case was discussed on Rounds with the Respiratory Therapy Staff  Allyne Gee, MD [facility] Pulmonary Critical Care Medicine Sleep Medicine

## 2018-10-10 DIAGNOSIS — I6329 Cerebral infarction due to unspecified occlusion or stenosis of other precerebral arteries: Secondary | ICD-10-CM | POA: Diagnosis not present

## 2018-10-10 DIAGNOSIS — J398 Other specified diseases of upper respiratory tract: Secondary | ICD-10-CM | POA: Diagnosis not present

## 2018-10-10 DIAGNOSIS — Z930 Tracheostomy status: Secondary | ICD-10-CM | POA: Diagnosis not present

## 2018-10-10 DIAGNOSIS — J9621 Acute and chronic respiratory failure with hypoxia: Secondary | ICD-10-CM | POA: Diagnosis not present

## 2018-10-10 NOTE — Progress Notes (Addendum)
Pulmonary [place]   PULMONARY CRITICAL CARE SERVICE  PROGRESS NOTE  Date of Service: 10/10/2018  Joahan Swatzell  AYT:[id number]  DOB: 1955/12/30   DOA: 09/16/2018  Referring Physician: Merton Border, MD  HPI: Philippe Gang is a 63 y.o. male seen for follow up of Acute on Chronic Respiratory Failure.  Patient continues on room air since decannulation satting well with no distress.  Medications: Reviewed on Rounds  Physical Exam:  Vitals: Pulse 72 respiration 20 BP 126/94 O2 sat 95% temp 97.5  Ventilator Settings room air  . General: Comfortable at this time . Eyes: Grossly normal lids, irises & conjunctiva . ENT: grossly tongue is normal . Neck: no obvious mass . Cardiovascular: S1 S2 normal no gallop . Respiratory: No rales or rhonchi noted . Abdomen: soft . Skin: no rash seen on limited exam . Musculoskeletal: not rigid . Psychiatric:unable to assess . Neurologic: no seizure no involuntary movements         Lab Data:   Basic Metabolic Panel: Recent Labs  Lab 10/06/18 0527  NA 144  K 3.6  CL 107  CO2 27  GLUCOSE 95  BUN 19  CREATININE 0.97  CALCIUM 9.7  MG 2.2  PHOS 4.0    ABG: No results for input(s): PHART, PCO2ART, PO2ART, HCO3, O2SAT in the last 168 hours.  Liver Function Tests: No results for input(s): AST, ALT, ALKPHOS, BILITOT, PROT, ALBUMIN in the last 168 hours. No results for input(s): LIPASE, AMYLASE in the last 168 hours. No results for input(s): AMMONIA in the last 168 hours.  CBC: Recent Labs  Lab 10/06/18 0527  WBC 6.4  HGB 14.1  HCT 42.4  MCV 89.3  PLT 196    Cardiac Enzymes: No results for input(s): CKTOTAL, CKMB, CKMBINDEX, TROPONINI in the last 168 hours.  BNP (last 3 results) No results for input(s): BNP in the last 8760 hours.  ProBNP (last 3 results) No results for input(s): PROBNP in the last 8760 hours.  Radiological Exams: No results found.  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 Acute on chronic respiratory failure with hypoxia clinically improved and has been doing well with capping since decannulation.  Continue supportive measures and pulmonary toilet. 2. Acute stroke at baseline therapy as tolerated 3. Tracheostomy remains in place we will continue with present management 4. Retained tracheal secretions resolved continue with supportive care pulmonary toilet   I have personally seen and evaluated the patient, evaluated laboratory and imaging results, formulated the assessment and plan and placed orders. The Patient requires high complexity decision making for assessment and support.  Case was discussed on Rounds with the Respiratory Therapy Staff  Allyne Gee, MD [facility] Pulmonary Critical Care Medicine Sleep Medicine

## 2018-10-13 LAB — BASIC METABOLIC PANEL
Anion gap: 11 (ref 5–15)
BUN: 22 mg/dL (ref 8–23)
CO2: 21 mmol/L — ABNORMAL LOW (ref 22–32)
Calcium: 9.5 mg/dL (ref 8.9–10.3)
Chloride: 108 mmol/L (ref 98–111)
Creatinine, Ser: 1.35 mg/dL — ABNORMAL HIGH (ref 0.61–1.24)
GFR calc Af Amer: >60 mL/min (ref >60)
GFR calc non Af Amer: 55 mL/min — ABNORMAL LOW (ref >60)
Glucose, Bld: 122 mg/dL — ABNORMAL HIGH (ref 70–99)
Potassium: 3.6 mmol/L (ref 3.5–5.1)
Sodium: 140 mmol/L (ref 135–145)

## 2018-10-13 LAB — T4, FREE: Free T4: 1.07 ng/dL (ref 0.61–1.12)

## 2018-10-13 LAB — TSH: TSH: 1.13 u[IU]/mL (ref 0.350–4.500)

## 2018-10-15 LAB — SARS CORONAVIRUS 2 BY RT PCR (HOSPITAL ORDER, PERFORMED IN ~~LOC~~ HOSPITAL LAB): SARS Coronavirus 2: NEGATIVE

## 2018-10-16 LAB — NOVEL CORONAVIRUS, NAA (HOSP ORDER, SEND-OUT TO REF LAB; TAT 18-24 HRS): SARS-CoV-2, NAA: NOT DETECTED

## 2018-10-31 DEATH — deceased

## 2019-07-06 IMAGING — DX ABDOMEN - 1 VIEW
1 series · 1 of 1 positions shown · IV contrast (agent unspecified)
Comparison: None.

CLINICAL DATA: Peg tube removed by patient. Foley catheter
introduced into PEG tract with contrast injected.

EXAM:
ABDOMEN - 1 VIEW

[abdomen kub]
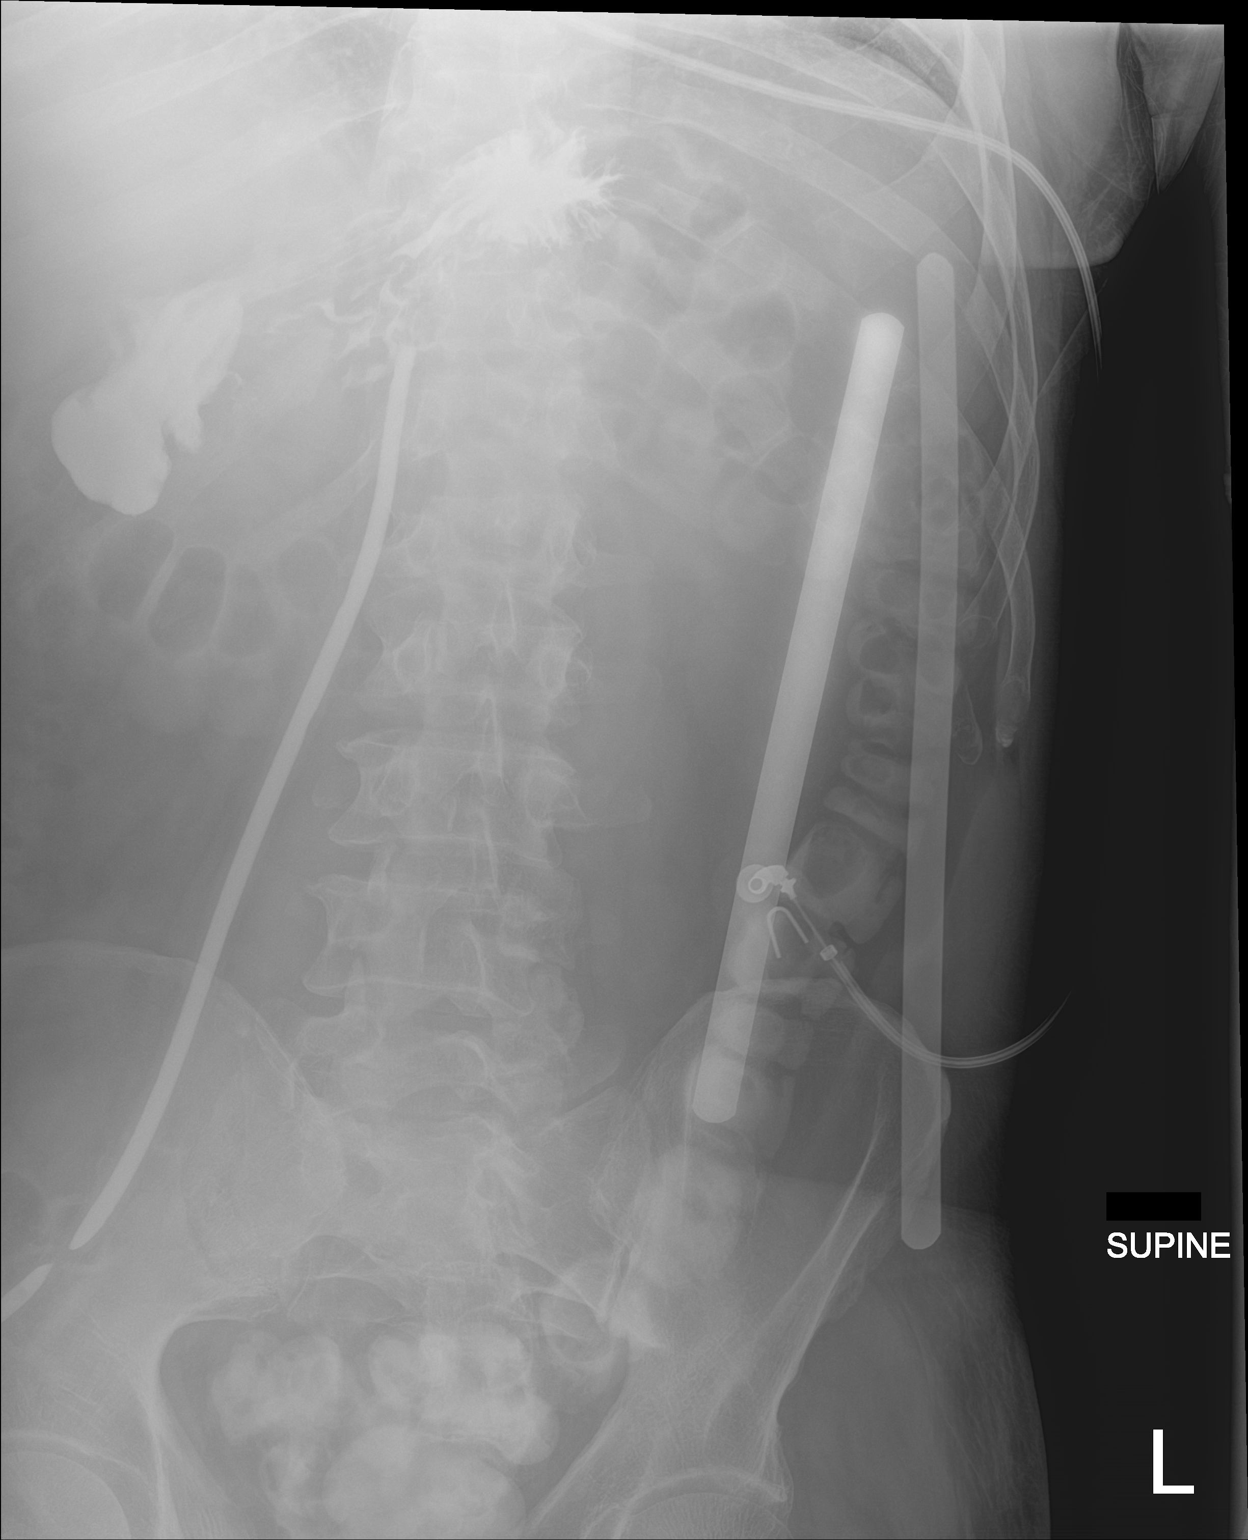

[1 of 1 positions shown; findings below may reference images not displayed]

FINDINGS: Catheter is noted overlying the stomach filled with contrast. The
stomach contains oral contrast. No extraluminal contrast is
otherwise noted.
IMPRESSION: Catheter placed through the PEG tract noted with tip in the stomach.
